# Patient Record
Sex: Male | Born: 1970 | Race: White | Hispanic: No | Marital: Married | Smoking: Former smoker
Health system: Southern US, Community
[De-identification: ages and names within clinical notes are randomized; demographics above are authoritative.]

## PROBLEM LIST (undated history)

## (undated) DIAGNOSIS — E049 Nontoxic goiter, unspecified: Secondary | ICD-10-CM

## (undated) HISTORY — PX: CHOLECYSTECTOMY: SHX55

## (undated) HISTORY — PX: HERNIA REPAIR: SHX51

## (undated) HISTORY — PX: TOTAL THYROIDECTOMY: SHX2547

---

## 2001-01-12 ENCOUNTER — Emergency Department (HOSPITAL_COMMUNITY): Admission: EM | Admit: 2001-01-12 | Discharge: 2001-01-12 | Payer: Self-pay | Admitting: Emergency Medicine

## 2007-10-26 ENCOUNTER — Encounter: Payer: Self-pay | Admitting: Gastroenterology

## 2007-10-28 ENCOUNTER — Encounter: Payer: Self-pay | Admitting: Gastroenterology

## 2007-12-29 ENCOUNTER — Encounter: Payer: Self-pay | Admitting: Gastroenterology

## 2008-11-07 ENCOUNTER — Encounter: Payer: Self-pay | Admitting: Gastroenterology

## 2008-11-07 ENCOUNTER — Emergency Department (HOSPITAL_BASED_OUTPATIENT_CLINIC_OR_DEPARTMENT_OTHER): Admission: EM | Admit: 2008-11-07 | Discharge: 2008-11-07 | Payer: Self-pay | Admitting: Emergency Medicine

## 2008-12-05 ENCOUNTER — Telehealth: Payer: Self-pay | Admitting: Gastroenterology

## 2011-08-29 LAB — CBC
HCT: 50.3 % (ref 39.0–52.0)
Hemoglobin: 17 g/dL (ref 13.0–17.0)
MCHC: 33.7 g/dL (ref 30.0–36.0)
MCV: 85.8 fL (ref 78.0–100.0)
RBC: 5.87 MIL/uL — ABNORMAL HIGH (ref 4.22–5.81)
RDW: 13.2 % (ref 11.5–15.5)

## 2011-08-29 LAB — URINALYSIS, ROUTINE W REFLEX MICROSCOPIC
Ketones, ur: NEGATIVE mg/dL
Nitrite: NEGATIVE
Protein, ur: NEGATIVE mg/dL
pH: 6.5 (ref 5.0–8.0)

## 2011-08-29 LAB — COMPREHENSIVE METABOLIC PANEL
ALT: 40 U/L (ref 0–53)
BUN: 17 mg/dL (ref 6–23)
CO2: 24 mEq/L (ref 19–32)
Calcium: 10.1 mg/dL (ref 8.4–10.5)
Creatinine, Ser: 1.2 mg/dL (ref 0.4–1.5)
GFR calc non Af Amer: >60 mL/min (ref >60)
Glucose, Bld: 128 mg/dL — ABNORMAL HIGH (ref 70–99)
Sodium: 139 mEq/L (ref 135–145)
Total Protein: 7.4 g/dL (ref 6.0–8.3)

## 2011-08-29 LAB — DIFFERENTIAL
Eosinophils Absolute: 0.1 10*3/uL (ref 0.0–0.7)
Lymphs Abs: 1.9 10*3/uL (ref 0.7–4.0)
Monocytes Relative: 8 % (ref 3–12)
Neutro Abs: 5.9 10*3/uL (ref 1.7–7.7)
Neutrophils Relative %: 68 % (ref 43–77)

## 2011-08-29 LAB — LIPASE, BLOOD: Lipase: 120 U/L (ref 23–300)

## 2014-02-22 ENCOUNTER — Emergency Department (HOSPITAL_BASED_OUTPATIENT_CLINIC_OR_DEPARTMENT_OTHER)
Admission: EM | Admit: 2014-02-22 | Discharge: 2014-02-22 | Disposition: A | Discharge status: Home / Self Care | Payer: BC Managed Care – PPO | Attending: Emergency Medicine | Admitting: Emergency Medicine

## 2014-02-22 ENCOUNTER — Encounter (HOSPITAL_BASED_OUTPATIENT_CLINIC_OR_DEPARTMENT_OTHER): Payer: Self-pay | Admitting: Emergency Medicine

## 2014-02-22 DIAGNOSIS — S51809A Unspecified open wound of unspecified forearm, initial encounter: Secondary | ICD-10-CM | POA: Insufficient documentation

## 2014-02-22 DIAGNOSIS — Z87891 Personal history of nicotine dependence: Secondary | ICD-10-CM | POA: Insufficient documentation

## 2014-02-22 DIAGNOSIS — Z79899 Other long term (current) drug therapy: Secondary | ICD-10-CM | POA: Insufficient documentation

## 2014-02-22 DIAGNOSIS — Y929 Unspecified place or not applicable: Secondary | ICD-10-CM | POA: Insufficient documentation

## 2014-02-22 DIAGNOSIS — IMO0001 Reserved for inherently not codable concepts without codable children: Secondary | ICD-10-CM | POA: Insufficient documentation

## 2014-02-22 DIAGNOSIS — S51859A Open bite of unspecified forearm, initial encounter: Secondary | ICD-10-CM

## 2014-02-22 DIAGNOSIS — Y939 Activity, unspecified: Secondary | ICD-10-CM | POA: Insufficient documentation

## 2014-02-22 DIAGNOSIS — E049 Nontoxic goiter, unspecified: Secondary | ICD-10-CM | POA: Insufficient documentation

## 2014-02-22 DIAGNOSIS — Z23 Encounter for immunization: Secondary | ICD-10-CM | POA: Insufficient documentation

## 2014-02-22 DIAGNOSIS — W5501XA Bitten by cat, initial encounter: Secondary | ICD-10-CM

## 2014-02-22 HISTORY — DX: Nontoxic goiter, unspecified: E04.9

## 2014-02-22 MED ORDER — TETANUS-DIPHTH-ACELL PERTUSSIS 5-2.5-18.5 LF-MCG/0.5 IM SUSP
0.5000 mL | Freq: Once | INTRAMUSCULAR | Status: AC
  Administered 2014-02-22: 0.5 mL via INTRAMUSCULAR
  Filled 2014-02-22: qty 0.5

## 2014-02-22 MED ORDER — AMOXICILLIN-POT CLAVULANATE 875-125 MG PO TABS: 1.0000 | ORAL_TABLET | Freq: Two times a day (BID) | ORAL | Status: DC

## 2014-02-22 MED ORDER — AMOXICILLIN-POT CLAVULANATE 875-125 MG PO TABS
ORAL_TABLET | ORAL | Status: DC
Start: 2014-02-22 — End: 2014-02-22
  Filled 2014-02-22: qty 1

## 2014-02-22 MED ORDER — AMOXICILLIN-POT CLAVULANATE 875-125 MG PO TABS
1.0000 | ORAL_TABLET | Freq: Once | ORAL | Status: AC
  Administered 2014-02-22: 1 via ORAL

## 2014-02-22 NOTE — ED Notes (Signed)
MD at bedside. 

## 2014-02-22 NOTE — Discharge Instructions (Signed)

## 2014-02-22 NOTE — ED Notes (Signed)
Pt bit on left arm by his own cat.  4 distinct puncture wounds.  No active bleeding at this time.  Cat is current on all immunizations.  Pt took 1 "old" Amoxicillin 500 mg at the insistence of his wife.

## 2014-02-22 NOTE — ED Provider Notes (Signed)
CSN: [id number]     Arrival date & time 02/22/14  0026 History   First MD Initiated Contact with Patient 02/22/14 0055     Chief Complaint  Patient presents with  . Animal Bite     (Consider location/radiation/quality/duration/timing/severity/associated sxs/prior Treatment) HPI This is a 43 year old male who was bitten on the left forearm by his own cat. His cat is an indoor only cat. His cat is up-to-date on his immunizations. There is mild to moderate pain associated with the resultant puncture wound. There are also a couple of superficial abrasions to his left forearm. He took amoxicillin 500 mg prior to arrival. This event occurred about 11:30 yesterday evening.  Past Medical History  Diagnosis Date  . Goiter    Past Surgical History  Procedure Laterality Date  . Cholecystectomy    . Total thyroidectomy    . Hernia repair     No family history on file. History  Substance Use Topics  . Smoking status: Former Games developermoker  . Smokeless tobacco: Not on file  . Alcohol Use: No    Review of Systems  All other systems reviewed and are negative.   Allergies  Erythromycin  Home Medications   Current Outpatient Rx  Name  Route  Sig  Dispense  Refill  . thyroid (ARMOUR) 90 MG tablet   Oral   Take 90 mg by mouth 2 (two) times daily.          BP 136/84  Pulse 92  Temp(Src) 98.1 F (36.7 C) (Oral)  Resp 20  Ht 6\' 2"  (1.88 m)  Wt 252 lb (114.306 kg)  BMI 32.34 kg/m2  SpO2 98%  Physical Exam General: Well-developed, well-nourished male in no acute distress; appearance consistent with age of record HENT: normocephalic; atraumatic Eyes: pupils equal, round and reactive to light; extraocular muscles intact Neck: supple Heart: regular rate and rhythm Lungs: clear to auscultation bilaterally Abdomen: soft; nondistended; nontender; no masses or hepatosplenomegaly; bowel sounds present Extremities: No deformity; full range of motion; pulses normal Neurologic: Awake, alert  and oriented; motor function intact in all extremities and symmetric; no facial droop Skin: Warm and dry; puncture wounds consistent with cat bite left forearm, a few superficial linear abrasions left forearm   Psychiatric: Normal mood and affect    ED Course  Procedures (including critical care time)  MDM      Hanley SeamenJohn L Serene Kopf, MD 02/22/14 0105

## 2014-07-22 ENCOUNTER — Encounter (HOSPITAL_BASED_OUTPATIENT_CLINIC_OR_DEPARTMENT_OTHER): Payer: Self-pay | Admitting: Emergency Medicine

## 2014-07-22 ENCOUNTER — Emergency Department (HOSPITAL_BASED_OUTPATIENT_CLINIC_OR_DEPARTMENT_OTHER): Payer: BC Managed Care – PPO

## 2014-07-22 ENCOUNTER — Emergency Department (HOSPITAL_BASED_OUTPATIENT_CLINIC_OR_DEPARTMENT_OTHER)
Admission: EM | Admit: 2014-07-22 | Discharge: 2014-07-22 | Disposition: A | Discharge status: Home / Self Care | Payer: BC Managed Care – PPO | Attending: Emergency Medicine | Admitting: Emergency Medicine

## 2014-07-22 DIAGNOSIS — Y9301 Activity, walking, marching and hiking: Secondary | ICD-10-CM | POA: Insufficient documentation

## 2014-07-22 DIAGNOSIS — S99929A Unspecified injury of unspecified foot, initial encounter: Secondary | ICD-10-CM

## 2014-07-22 DIAGNOSIS — Y929 Unspecified place or not applicable: Secondary | ICD-10-CM | POA: Insufficient documentation

## 2014-07-22 DIAGNOSIS — S9030XA Contusion of unspecified foot, initial encounter: Secondary | ICD-10-CM | POA: Diagnosis not present

## 2014-07-22 DIAGNOSIS — Z87891 Personal history of nicotine dependence: Secondary | ICD-10-CM | POA: Insufficient documentation

## 2014-07-22 DIAGNOSIS — Z79899 Other long term (current) drug therapy: Secondary | ICD-10-CM | POA: Diagnosis not present

## 2014-07-22 DIAGNOSIS — S8990XA Unspecified injury of unspecified lower leg, initial encounter: Secondary | ICD-10-CM | POA: Diagnosis present

## 2014-07-22 DIAGNOSIS — X500XXA Overexertion from strenuous movement or load, initial encounter: Secondary | ICD-10-CM | POA: Insufficient documentation

## 2014-07-22 DIAGNOSIS — S93609A Unspecified sprain of unspecified foot, initial encounter: Secondary | ICD-10-CM | POA: Insufficient documentation

## 2014-07-22 DIAGNOSIS — Z792 Long term (current) use of antibiotics: Secondary | ICD-10-CM | POA: Diagnosis not present

## 2014-07-22 DIAGNOSIS — S99919A Unspecified injury of unspecified ankle, initial encounter: Secondary | ICD-10-CM

## 2014-07-22 DIAGNOSIS — E049 Nontoxic goiter, unspecified: Secondary | ICD-10-CM | POA: Insufficient documentation

## 2014-07-22 DIAGNOSIS — S93602A Unspecified sprain of left foot, initial encounter: Secondary | ICD-10-CM

## 2014-07-22 MED ORDER — OXYCODONE-ACETAMINOPHEN 5-325 MG PO TABS: 1.0000 | ORAL_TABLET | ORAL | Status: DC | PRN

## 2014-07-22 MED ORDER — OXYCODONE-ACETAMINOPHEN 5-325 MG PO TABS
1.0000 | ORAL_TABLET | Freq: Once | ORAL | Status: AC
  Administered 2014-07-22: 1 via ORAL
  Filled 2014-07-22: qty 1

## 2014-07-22 NOTE — Discharge Instructions (Signed)
Ligament Sprain Ligaments are tough, fibrous tissues that hold bones together at the joints. A sprain can occur when a ligament is stretched. This injury may take several weeks to heal. HOME CARE INSTRUCTIONS   Rest the injured area for as long as directed by your caregiver. Then slowly start using the joint as directed by your caregiver and as the pain allows.  Keep the affected joint raised if possible to lessen swelling.  Apply ice for 15-20 minutes to the injured area every couple hours for the first half day, then 03-04 times per day for the first 48 hours. Put the ice in a plastic bag and place a towel between the bag of ice and your skin.  Wear any splinting, casting, or elastic bandage applications as instructed.  Only take over-the-counter or prescription medicines for pain, discomfort, or fever as directed by your caregiver. Do not use aspirin immediately after the injury unless instructed by your caregiver. Aspirin can cause increased bleeding and bruising of the tissues.  If you were given crutches, continue to use them as instructed and do not resume weight bearing on the affected extremity until instructed. SEEK MEDICAL CARE IF:   Your bruising, swelling, or pain increases.  You have cold and numb fingers or toes if your arm or leg was injured. SEEK IMMEDIATE MEDICAL CARE IF:   Your toes are numb or blue if your leg was injured.  Your fingers are numb or blue if your arm was injured.  Your pain is not responding to medicines and continues to stay the same or gets worse. MAKE SURE YOU:   Understand these instructions.  Will watch your condition.  Will get help right away if you are not doing well or get worse. Document Released: 11/07/2000 Document Revised: 02/02/2012 Document Reviewed: 09/05/2008 [facility] Patient Information 2015 [place], [place]. This information is not intended to replace advice given to you by your health care provider. Make sure you discuss any  questions you have with your health care provider.  Foot Sprain The muscles and cord like structures which attach muscle to bone (tendons) that surround the feet are made up of units. A foot sprain can occur at the weakest spot in any of these units. This condition is most often caused by injury to or overuse of the foot, as from playing contact sports, or aggravating a previous injury, or from poor conditioning, or obesity. SYMPTOMS  Pain with movement of the foot.  Tenderness and swelling at the injury site.  Loss of strength is present in moderate or severe sprains. THE THREE GRADES OR SEVERITY OF FOOT SPRAIN ARE:  Mild (Grade I): Slightly pulled muscle without tearing of muscle or tendon fibers or loss of strength.  Moderate (Grade II): Tearing of fibers in a muscle, tendon, or at the attachment to bone, with small decrease in strength.  Severe (Grade III): Rupture of the muscle-tendon-bone attachment, with separation of fibers. Severe sprain requires surgical repair. Often repeating (chronic) sprains are caused by overuse. Sudden (acute) sprains are caused by direct injury or over-use. DIAGNOSIS  Diagnosis of this condition is usually by your own observation. If problems continue, a caregiver may be required for further evaluation and treatment. X-rays may be required to make sure there are not breaks in the bones (fractures) present. Continued problems may require physical therapy for treatment. PREVENTION  Use strength and conditioning exercises appropriate for your sport.  Warm up properly prior to working out.  Use athletic shoes that are made  for the sport you are participating in.  Allow adequate time for healing. Early return to activities makes repeat injury more likely, and can lead to an unstable arthritic foot that can result in prolonged disability. Mild sprains generally heal in 3 to 10 days, with moderate and severe sprains taking 2 to 10 weeks. Your caregiver can help  you determine the proper time required for healing. HOME CARE INSTRUCTIONS   Apply ice to the injury for 15-20 minutes, 03-04 times per day. Put the ice in a plastic bag and place a towel between the bag of ice and your skin.  An elastic wrap (like an Ace bandage) may be used to keep swelling down.  Keep foot above the level of the heart, or at least raised on a footstool, when swelling and pain are present.  Try to avoid use other than gentle range of motion while the foot is painful. Do not resume use until instructed by your caregiver. Then begin use gradually, not increasing use to the point of pain. If pain does develop, decrease use and continue the above measures, gradually increasing activities that do not cause discomfort, until you gradually achieve normal use.  Use crutches if and as instructed, and for the length of time instructed.  Keep injured foot and ankle wrapped between treatments.  Massage foot and ankle for comfort and to keep swelling down. Massage from the toes up towards the knee.  Only take over-the-counter or prescription medicines for pain, discomfort, or fever as directed by your caregiver. SEEK IMMEDIATE MEDICAL CARE IF:   Your pain and swelling increase, or pain is not controlled with medications.  You have loss of feeling in your foot or your foot turns cold or blue.  You develop new, unexplained symptoms, or an increase of the symptoms that brought you to your caregiver. MAKE SURE YOU:   Understand these instructions.  Will watch your condition.  Will get help right away if you are not doing well or get worse. Document Released: 05/02/2002 Document Revised: 02/02/2012 Document Reviewed: 06/29/2008 [facility] Patient Information 2015 [place], [place]. This information is not intended to replace advice given to you by your health care provider. Make sure you discuss any questions you have with your health care provider.

## 2014-07-22 NOTE — ED Notes (Signed)
Pt states his teenaged-son has a cam walker at home; Dr. Blinda Leatherwood states it's okay for him to use it instead of getting a new one here. Danna Hefty, RN

## 2014-07-22 NOTE — ED Notes (Signed)
While walking down ramp pt turned over his foot

## 2014-07-22 NOTE — ED Notes (Signed)
I was asked to place ace wrap on patient ankle as he had cam walker at home. Patient's wife brought in set of crutches which I re-fit/adjusted. Patient's own crutches were were severely mal-adjusted. I completed and helped patient with crutch training.

## 2014-07-22 NOTE — ED Provider Notes (Signed)
CSN: [id number]     Arrival date & time 07/22/14  2219 History  This chart was scribed for Gilda Crease, * by Evon Slack, ED Scribe. This patient was seen in room MH11/MH11 and the patient's care was started at 10:40 PM.     Chief Complaint  Patient presents with  . Foot Injury   Patient is a 43 y.o. male presenting with foot injury. The history is provided by the patient. No language interpreter was used.  Foot Injury  HPI Comments: Carl Hicks is a 43 y.o. male who presents to the Emergency Department complaining of left foot injury onset PTA. He states he has associated swelling and bruising. He describes the pain as throbbing. He states was walking and rolled his ankle. He doesn't report any numbness or tingling.   Past Medical History  Diagnosis Date  . Goiter    Past Surgical History  Procedure Laterality Date  . Cholecystectomy    . Total thyroidectomy    . Hernia repair     History reviewed. No pertinent family history. History  Substance Use Topics  . Smoking status: Former Games developer  . Smokeless tobacco: Not on file  . Alcohol Use: No    Review of Systems  Musculoskeletal: Positive for arthralgias and joint swelling.  All other systems reviewed and are negative.   Allergies  Erythromycin  Home Medications   Prior to Admission medications   Medication Sig Start Date End Date Taking? Authorizing Provider  amoxicillin-clavulanate (AUGMENTIN) 875-125 MG per tablet Take 1 tablet by mouth 2 (two) times daily. One po bid x 7 days 02/22/14   Carlisle Beers Molpus, MD  oxyCODONE-acetaminophen (PERCOCET) 5-325 MG per tablet Take 1-2 tablets by mouth every 4 (four) hours as needed. 07/22/14   Gilda Crease, MD  thyroid (ARMOUR) 90 MG tablet Take 90 mg by mouth 2 (two) times daily.    Historical Provider, MD   Triage Vitals: BP 135/95  Pulse 101  Temp(Src) 99.4 F (37.4 C) (Oral)  Resp 20  Ht  (1.88 m)  Wt 258 lb (117.028 kg)  BMI 33.11 kg/m2  SpO2  97%  Physical Exam  Nursing note and vitals reviewed. Musculoskeletal: He exhibits tenderness.  Swelling, ecchymosis, tenderness on dorsal lateral aspect of left foot, no obvious deformity, Dorsal Pedis pulse 2+ Neurovascularly Intact, no proximal fibula tenderness    ED Course  Procedures (including critical care time) DIAGNOSTIC STUDIES: Oxygen Saturation is 97% on RA, normal by my interpretation.    COORDINATION OF CARE: 10:52 PM-Discussed treatment plan which includes left foot x-ray with pt at bedside and pt agreed to plan.     Labs Review Labs Reviewed - No data to display  Imaging Review Dg Foot Complete Left  07/22/2014   CLINICAL DATA:  Ankle injury while walking down a ramp.  EXAM: LEFT FOOT - COMPLETE 3+ VIEW  COMPARISON:  None.  FINDINGS: A well corticated fragment is evident lateral to the cuboid. There is some soft tissue swelling over the dorsum of the foot. No acute osseous abnormality is present.  IMPRESSION: 1. Soft tissue swelling over the dorsum of the foot. 2. No acute osseous abnormality.   Electronically Signed   By: Gennette Pac M.D.   On: 07/22/2014 22:48     EKG Interpretation None      MDM   Final diagnoses:  Foot sprain, left, initial encounter   Patient with pain and swelling at dorsal aspect of foot, xray negative. Will have  follow up with ortho (patient will try to see his son's ortho doctor in [facility], but was given on call provider contact in the event that he cannot arrange followup). Patient to use a cam walker, weightbearing as tolerated. He reports that he has a cam walker at home, did not take the Cam Walker here in the ER.   I personally performed the services described in this documentation, which was scribed in my presence. The recorded information has been reviewed and is accurate.      Gilda Crease, MD 07/22/14 [phone]

## 2015-01-24 IMAGING — CR DG FOOT COMPLETE 3+V*L*
3 series · 3 of 3 positions shown · non-contrast
Comparison: None.

CLINICAL DATA: Ankle injury while walking down a ramp.

EXAM:
LEFT FOOT - COMPLETE 3+ VIEW

[t foot ap left]
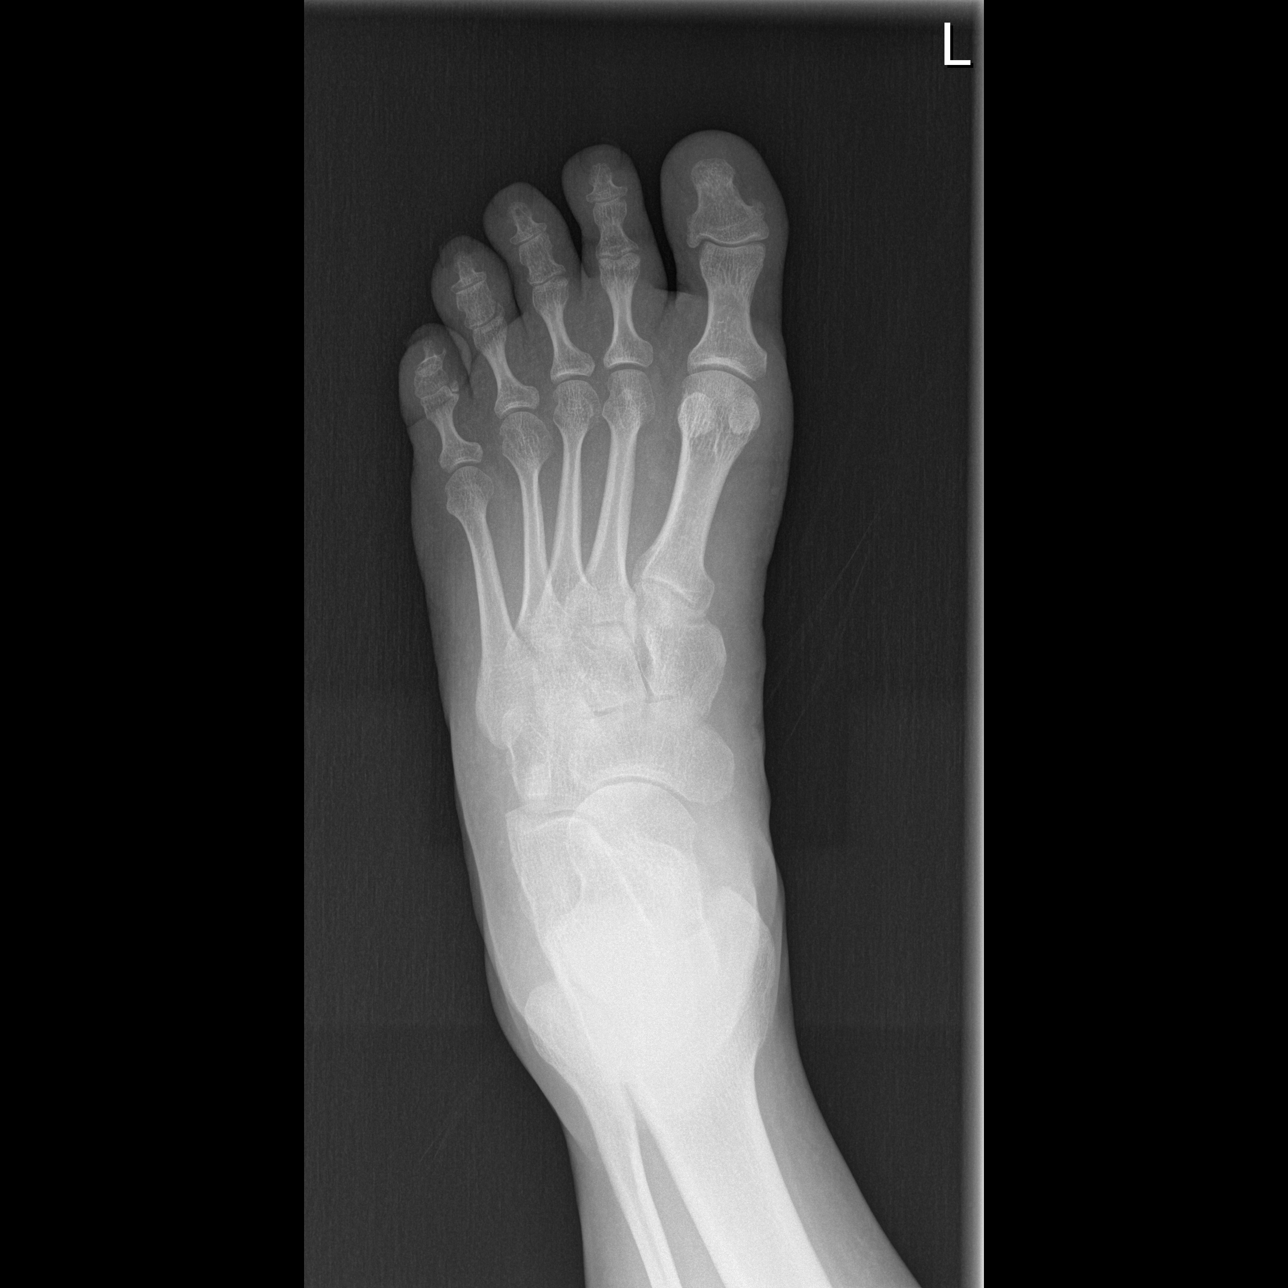

[t foot oblique left]
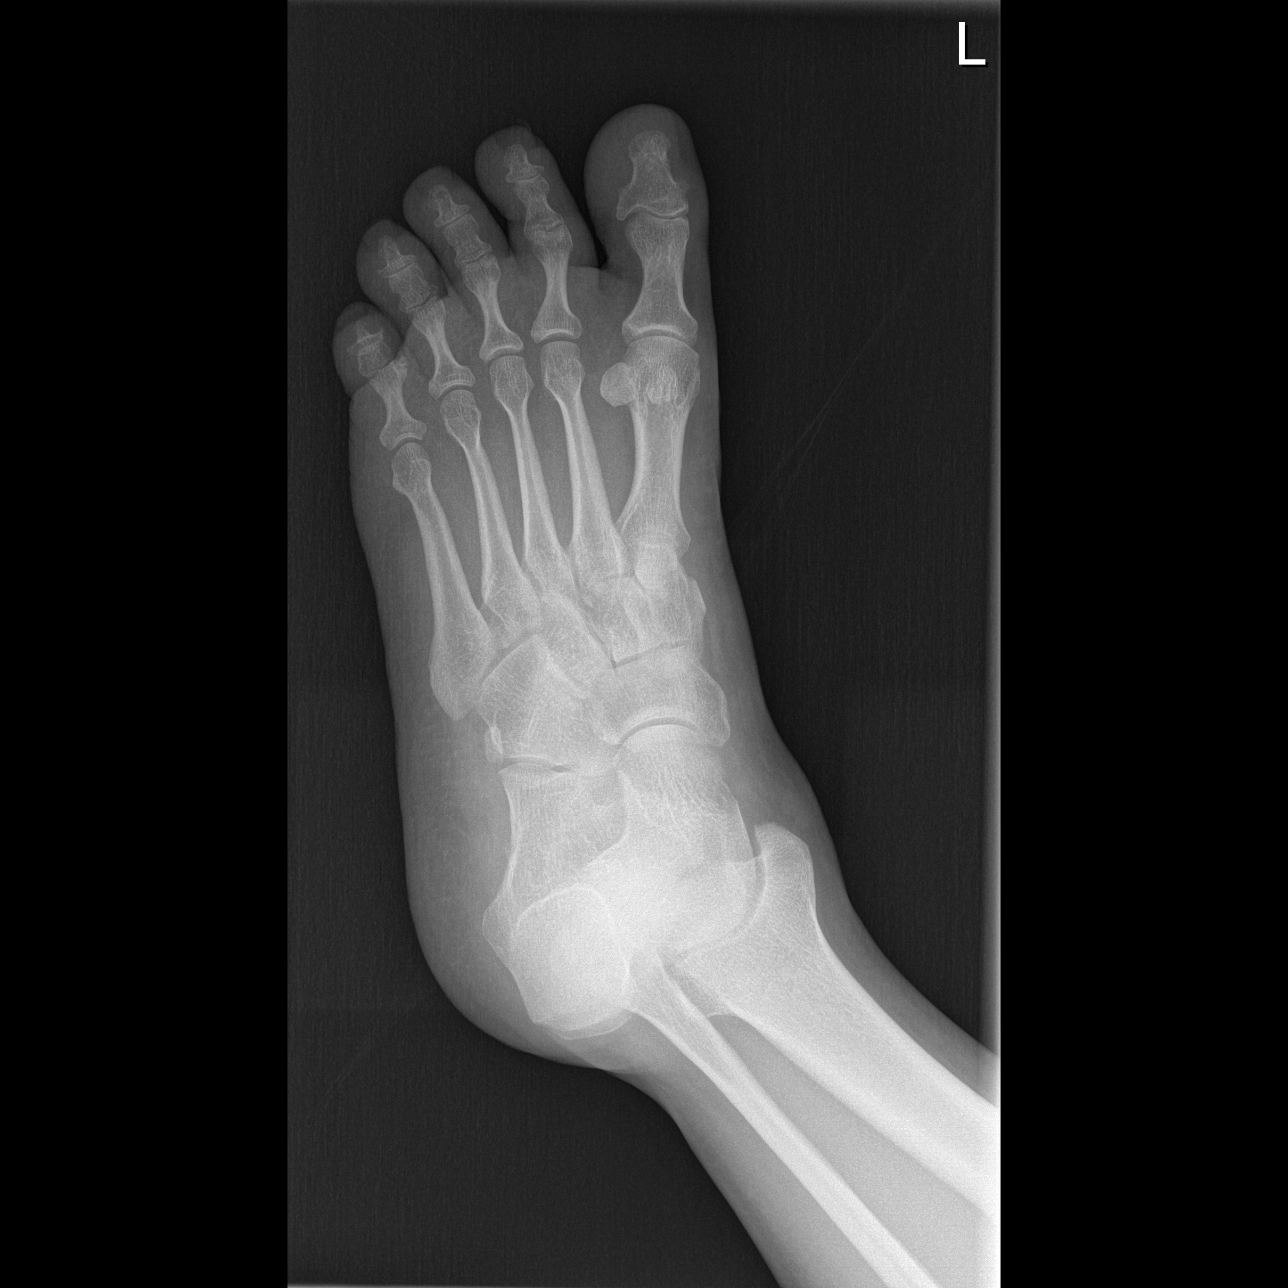

[t foot lat left]
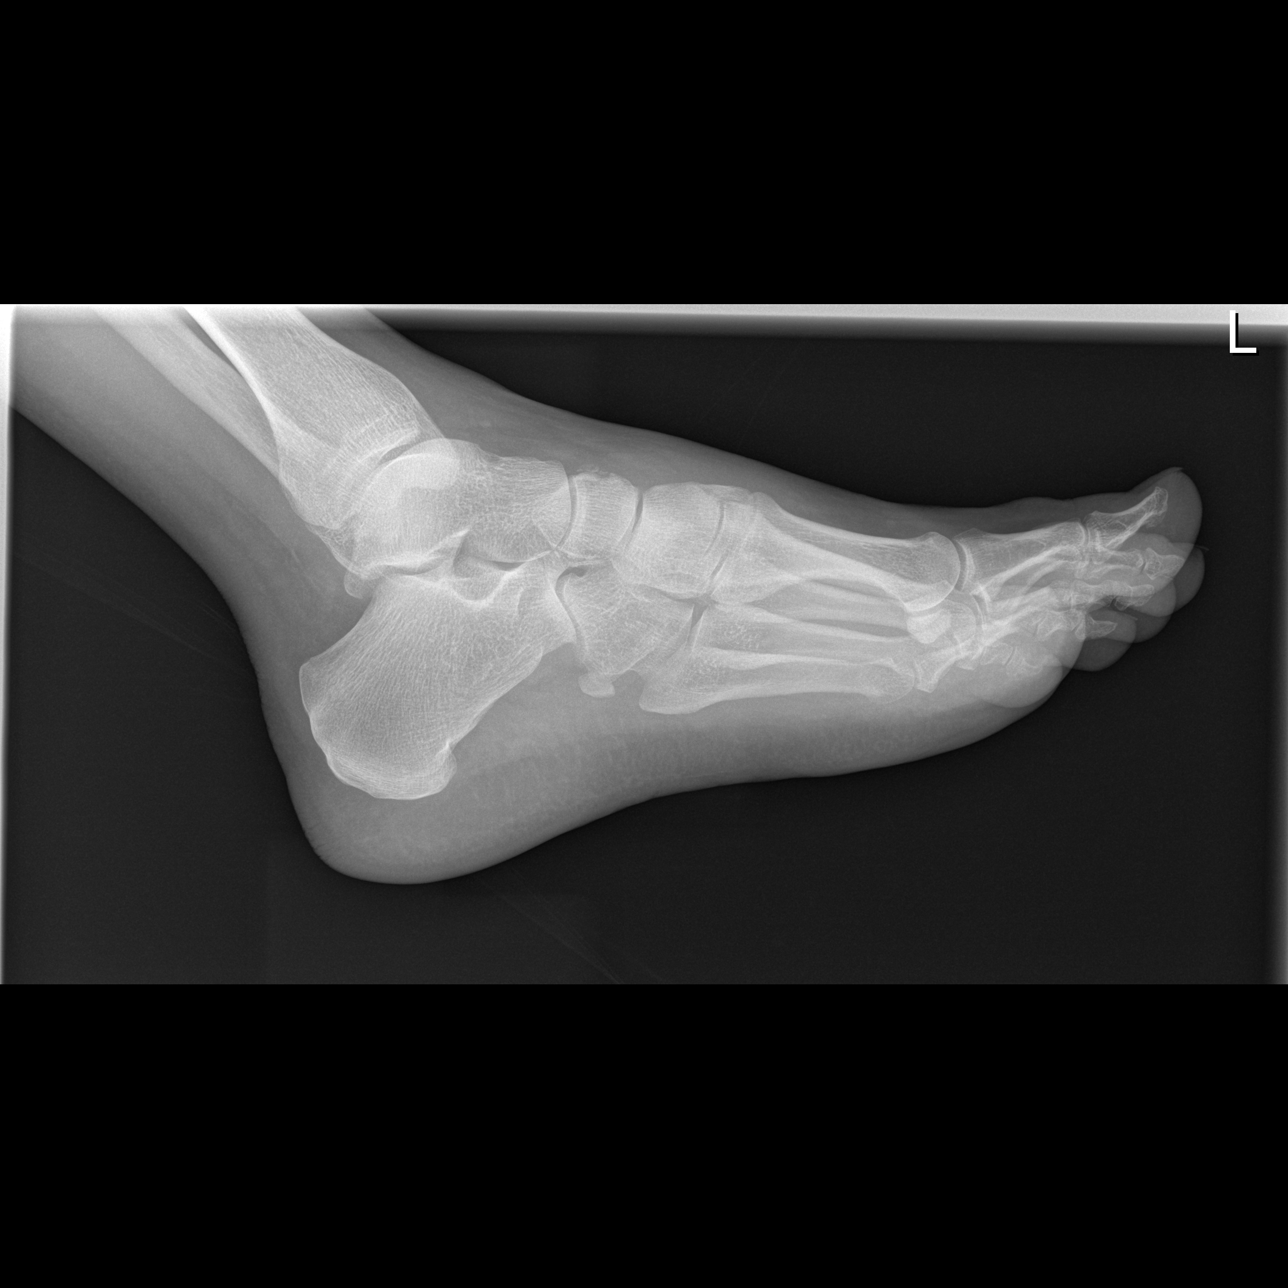

[3 of 3 positions shown; findings below may reference images not displayed]

FINDINGS: A well corticated fragment is evident lateral to the cuboid. There
is some soft tissue swelling over the dorsum of the foot. No acute
osseous abnormality is present.
IMPRESSION: 1. Soft tissue swelling over the dorsum of the foot.
2. No acute osseous abnormality.

## 2018-04-30 ENCOUNTER — Encounter: Payer: Self-pay | Admitting: Allergy & Immunology

## 2018-04-30 ENCOUNTER — Ambulatory Visit (INDEPENDENT_AMBULATORY_CARE_PROVIDER_SITE_OTHER): Payer: 59 | Admitting: Allergy & Immunology

## 2018-04-30 VITALS — BP 126/74 | HR 86 | Temp 98.1°F | Resp 17 | Ht 75.2 in | Wt 267.2 lb

## 2018-04-30 DIAGNOSIS — J302 Other seasonal allergic rhinitis: Secondary | ICD-10-CM | POA: Diagnosis not present

## 2018-04-30 DIAGNOSIS — J3089 Other allergic rhinitis: Secondary | ICD-10-CM | POA: Diagnosis not present

## 2018-04-30 DIAGNOSIS — E039 Hypothyroidism, unspecified: Secondary | ICD-10-CM | POA: Insufficient documentation

## 2018-04-30 DIAGNOSIS — I10 Essential (primary) hypertension: Secondary | ICD-10-CM | POA: Insufficient documentation

## 2018-04-30 MED ORDER — FEXOFENADINE HCL 180 MG PO TABS: 180.0000 mg | ORAL_TABLET | Freq: Every day | ORAL | 5 refills | Status: DC

## 2018-04-30 MED ORDER — MONTELUKAST SODIUM 10 MG PO TABS: 10.0000 mg | ORAL_TABLET | Freq: Every day | ORAL | 5 refills | Status: DC

## 2018-04-30 NOTE — Patient Instructions (Addendum)
1. Seasonal and perennial allergic rhinitis - Testing today showed: trees, weeds, indoor molds, outdoor molds, dust mites and cockroach - Avoidance measures provided. - Stop taking: Xyzal - Continue with: Nasacort (triamcinolone) one spray per nostril daily and Astelin (azelastine) 2 sprays per nostril 1-2 times daily as needed - Start taking: Allegra (fexofenadine) 180mg  table once daily, Singulair (montelukast) 10mg  daily and Pazeo (olopatadine) one drop per eye daily as needed - You can use an extra dose of the antihistamine, if needed, for breakthrough symptoms.  - Consider nasal saline rinses 1-2 times daily to remove allergens from the nasal cavities as well as help with mucous clearance (this is especially helpful to do before the nasal sprays are given) - Consider allergy shots as a means of long-term control. - Allergy shots "re-train" and "reset" the immune system to ignore environmental allergens and decrease the resulting immune response to those allergens (sneezing, itchy watery eyes, runny nose, nasal congestion, etc).    - Allergy shots improve symptoms in 75-85% of patients.  - Call your insurance company and let us know when you make a decision.  2. Return in about 6 months (around 10/30/2018).   Please inform us of any Emergency Department visits, hospitalizations, or changes in symptoms. Call us before going to the ED for breathing or allergy symptoms since we might be able to fit you in for a sick visit. Feel free to contact us anytime with any questions, problems, or concerns.  It was a pleasure to meet you today!  Websites that have reliable patient information: 1. American Academy of Asthma, Allergy, and Immunology: www.aaaai.org 2. Food Allergy Research and Education (FARE): foodallergy.org 3. Mothers of Asthmatics: http://www.asthmacommunitynetwork.org 4. American College of Allergy, Asthma, and Immunology: MissingWeapons.ca   Make sure you are registered to  vote!    Reducing Pollen Exposure  The American Academy of Allergy, Asthma and Immunology suggests the following steps to reduce your exposure to pollen during allergy seasons.    1. Do not hang sheets or clothing out to dry; pollen may collect on these items. 2. Do not mow lawns or spend time around freshly cut grass; mowing stirs up pollen. 3. Keep windows closed at night.  Keep car windows closed while driving. 4. Minimize morning activities outdoors, a time when pollen counts are usually at their highest. 5. Stay indoors as much as possible when pollen counts or humidity is high and on windy days when pollen tends to remain in the air longer. 6. Use air conditioning when possible.  Many air conditioners have filters that trap the pollen spores. 7. Use a HEPA room air filter to remove pollen form the indoor air you breathe.  Control of Mold Allergen   Mold and fungi can grow on a variety of surfaces provided certain temperature and moisture conditions exist.  Outdoor molds grow on plants, decaying vegetation and soil.  The major outdoor mold, Alternaria and Cladosporium, are found in very high numbers during hot and dry conditions.  Generally, a late Summer - Fall peak is seen for common outdoor fungal spores.  Rain will temporarily lower outdoor mold spore count, but counts rise rapidly when the rainy period ends.  The most important indoor molds are Aspergillus and Penicillium.  Dark, humid and poorly ventilated basements are ideal sites for mold growth.  The next most common sites of mold growth are the bathroom and the kitchen.  Outdoor (Seasonal) Mold Control  Positive outdoor molds via skin testing: Alternaria  1. Use  air conditioning and keep windows closed 2. Avoid exposure to decaying vegetation. 3. Avoid leaf raking. 4. Avoid grain handling. 5. Consider wearing a face mask if working in moldy areas.  6.   Indoor (Perennial) Mold Control   Positive indoor molds via skin  testing: Aspergillus, Penicillium, Fusarium, Aureobasidium (Pullulara) and Rhizopus  1. Maintain humidity below 50%. 2. Clean washable surfaces with 5% bleach solution. 3. Remove sources e.g. contaminated carpets.     Control of House Dust Mite Allergen    House dust mites play a major role in allergic asthma and rhinitis.  They occur in environments with high humidity wherever human skin, the food for dust mites is found. High levels have been detected in dust obtained from mattresses, pillows, carpets, upholstered furniture, bed covers, clothes and soft toys.  The principal allergen of the house dust mite is found in its feces.  A gram of dust may contain 1,000 mites and 250,000 fecal particles.  Mite antigen is easily measured in the air during house cleaning activities.    1. Encase mattresses, including the box spring, and pillow, in an air tight cover.  Seal the zipper end of the encased mattresses with wide adhesive tape. 2. Wash the bedding in water of 130 degrees Farenheit weekly.  Avoid cotton comforters/quilts and flannel bedding: the most ideal bed covering is the dacron comforter. 3. Remove all upholstered furniture from the bedroom. 4. Remove carpets, carpet padding, rugs, and non-washable window drapes from the bedroom.  Wash drapes weekly or use plastic window coverings. 5. Remove all non-washable stuffed toys from the bedroom.  Wash stuffed toys weekly. 6. Have the room cleaned frequently with a vacuum cleaner and a damp dust-mop.  The patient should not be in a room which is being cleaned and should wait 1 hour after cleaning before going into the room. 7. Close and seal all heating outlets in the bedroom.  Otherwise, the room will become filled with dust-laden air.  An electric heater can be used to heat the room. 8. Reduce indoor humidity to less than 50%.  Do not use a humidifier.  Control of Cockroach Allergen  Cockroach allergen has been identified as an important  cause of acute attacks of asthma, especially in urban settings.  There are fifty-five species of cockroach that exist in the [place], however only three, the [place], [place] and Oriental species produce allergen that can affect patients with Asthma.  Allergens can be obtained from fecal particles, egg casings and secretions from cockroaches.    1. Remove food sources. 2. Reduce access to water. 3. Seal access and entry points. 4. Spray runways with 0.5-1% Diazinon or Chlorpyrifos 5. Blow boric acid power under stoves and refrigerator. 6. Place bait stations (hydramethylnon) at feeding sites.  Allergy Shots   Allergies are the result of a chain reaction that starts in the immune system. Your immune system controls how your body defends itself. For instance, if you have an allergy to pollen, your immune system identifies pollen as an invader or allergen. Your immune system overreacts by producing antibodies called Immunoglobulin E (IgE). These antibodies travel to cells that release chemicals, causing an allergic reaction.  The concept behind allergy immunotherapy, whether it is received in the form of shots or tablets, is that the immune system can be desensitized to specific allergens that trigger allergy symptoms. Although it requires time and patience, the payback can be long-term relief.  How Do Allergy Shots Work?  Allergy shots work much  like a vaccine. Your body responds to injected amounts of a particular allergen given in increasing doses, eventually developing a resistance and tolerance to it. Allergy shots can lead to decreased, minimal or no allergy symptoms.  There generally are two phases: build-up and maintenance. Build-up often ranges from three to six months and involves receiving injections with increasing amounts of the allergens. The shots are typically given once or twice a week, though more rapid build-up schedules are sometimes used.  The maintenance phase begins  when the most effective dose is reached. This dose is different for each person, depending on how allergic you are and your response to the build-up injections. Once the maintenance dose is reached, there are longer periods between injections, typically two to four weeks.  Occasionally doctors give cortisone-type shots that can temporarily reduce allergy symptoms. These types of shots are different and should not be confused with allergy immunotherapy shots.  Who Can Be Treated with Allergy Shots?  Allergy shots may be a good treatment approach for people with allergic rhinitis (hay fever), allergic asthma, conjunctivitis (eye allergy) or stinging insect allergy.   Before deciding to begin allergy shots, you should consider:  . The length of allergy season and the severity of your symptoms . Whether medications and/or changes to your environment can control your symptoms . Your desire to avoid long-term medication use . Time: allergy immunotherapy requires a major time commitment . Cost: may vary depending on your insurance coverage  Allergy shots for children age 9 and older are effective and often well tolerated. They might prevent the onset of new allergen sensitivities or the progression to asthma.  Allergy shots are not started on patients who are pregnant but can be continued on patients who become pregnant while receiving them. In some patients with other medical conditions or who take certain common medications, allergy shots may be of risk. It is important to mention other medications you talk to your allergist.   When Will I Feel Better?  Some may experience decreased allergy symptoms during the build-up phase. For others, it may take as long as 12 months on the maintenance dose. If there is no improvement after a year of maintenance, your allergist will discuss other treatment options with you.  If you aren't responding to allergy shots, it may be because there is not enough  dose of the allergen in your vaccine or there are missing allergens that were not identified during your allergy testing. Other reasons could be that there are high levels of the allergen in your environment or major exposure to non-allergic triggers like tobacco smoke.  What Is the Length of Treatment?  Once the maintenance dose is reached, allergy shots are generally continued for three to five years. The decision to stop should be discussed with your allergist at that time. Some people may experience a permanent reduction of allergy symptoms. Others may relapse and a longer course of allergy shots can be considered.  What Are the Possible Reactions?  The two types of adverse reactions that can occur with allergy shots are local and systemic. Common local reactions include very mild redness and swelling at the injection site, which can happen immediately or several hours after. A systemic reaction, which is less common, affects the entire body or a particular body system. They are usually mild and typically respond quickly to medications. Signs include increased allergy symptoms such as sneezing, a stuffy nose or hives.  Rarely, a serious systemic reaction called anaphylaxis can  develop. Symptoms include swelling in the throat, wheezing, a feeling of tightness in the chest, nausea or dizziness. Most serious systemic reactions develop within 30 minutes of allergy shots. This is why it is strongly recommended you wait in your doctor's office for 30 minutes after your injections. Your allergist is trained to watch for reactions, and his or her staff is trained and equipped with the proper medications to identify and treat them.  Who Should Administer Allergy Shots?  The preferred location for receiving shots is your prescribing allergist's office. Injections can sometimes be given at another facility where the physician and staff are trained to recognize and treat reactions, and have received  instructions by your prescribing allergist.    You can buy saline nose drops at a pharmacy, or you can make your own saline solution:  1. Add 1 cup (240 mL) distilled water to a clean container. If you use tap water, boil it first to sterilize it, and then let it cool until it is lukewarm.  2. Add 0.5 tsp (2.5 g) salt to the water. 3. Add 0.5 tsp (2.5 g) baking soda.

## 2018-04-30 NOTE — Progress Notes (Signed)
NEW PATIENT  Date of Service/Encounter:  04/30/18  Referring provider: Heron Nay, PA   Assessment:   Seasonal and perennial allergic rhinitis (trees, weeds, indoor molds, outdoor molds, dust mites and cockroach)  Plan/Recommendations:   1. Seasonal and perennial allergic rhinitis - Testing today showed: trees, weeds, indoor molds, outdoor molds, dust mites and cockroach - Avoidance measures provided. - Stop taking: Xyzal - Continue with: Nasacort (triamcinolone) one spray per nostril daily and Astelin (azelastine) 2 sprays per nostril 1-2 times daily as needed - Start taking: Allegra (fexofenadine) 180mg  table once daily, Singulair (montelukast) 10mg  daily and Pazeo (olopatadine) one drop per eye daily as needed - You can use an extra dose of the antihistamine, if needed, for breakthrough symptoms.  - Consider nasal saline rinses 1-2 times daily to remove allergens from the nasal cavities as well as help with mucous clearance (this is especially helpful to do before the nasal sprays are given) - Consider allergy shots as a means of long-term control. - Allergy shots "re-train" and "reset" the immune system to ignore environmental allergens and decrease the resulting immune response to those allergens (sneezing, itchy watery eyes, runny nose, nasal congestion, etc).    - Allergy shots improve symptoms in 75-85% of patients.  - Call your insurance company and let us know when you make a decision.  2. Return in about 6 months (around 10/30/2018).   Subjective:   Ulus Hazen is a 47 y.o. male presenting today for evaluation of  Chief Complaint  Patient presents with  . Allergic Rhinitis     Paden Senger has a history of the following: Patient Active Problem List   Diagnosis Date Noted  . Seasonal and perennial allergic rhinitis 04/30/2018  . Acquired hypothyroidism 04/30/2018  . Essential hypertension 04/30/2018    History obtained from: chart review and  patient.  Tahje Borawski was referred by Tobie Lords PA at [facility]  Samba is a 47 y.o. male presenting to establish care for environmental allergies. He has had symptoms for years but they were particularly bad this year. He has been on Zyrtec which causes sleepiness. He tends to have marked sleepiness with antihistamines as well as jitteriness. Xyzal did not knock him out quite as much but is not as effective. He might have tried Careers adviser. He has not tried Claritin. He typically uses cold compresses and endures the symptoms. He does have a nose spray but he has not used the Nasacort very often at all. He might have been on Astelin in the past. He does use [facility] All[facility] Eye drops which does provide some relief.   He does get treated with antibiotics for sinus infections and ear infections. He estimates that he hs treated every other year for sinus infections. This is very rare overall.   He was previously seen by [facility]. He had testing that was positive to tree and grass. He does not recall what happened with the practice but he never followed up with them. He has never been on shots in the past.   He has psoriasis and uses nothing in particular; this seems to be more of a contact dermatitis since he reports that it was worse when he had particular arm rests. He is on amlodipine for hypertension as well as thyroid medication. He has a CPAP machine for OSA. He sees a PA at [facility].   Otherwise, there is no history of other atopic diseases, including drug allergies, food allergies, stinging insect allergies, or urticaria.  There is no significant infectious history. Vaccinations are up to date.    Past Medical History: Patient Active Problem List   Diagnosis Date Noted  . Seasonal and perennial allergic rhinitis 04/30/2018  . Acquired hypothyroidism 04/30/2018  . Essential hypertension 04/30/2018    Medication List:  Allergies as of 04/30/2018      Reactions   Erythromycin     Gastritis      Medication List        Accurate as of 04/30/18  1:03 PM. Always use your most recent med list.          amLODipine 5 MG tablet Commonly known as:  NORVASC TK 1 TO 2 TS PO D   azelastine 0.1 % nasal spray Commonly known as:  ASTELIN Place into the nose.   fexofenadine 180 MG tablet Commonly known as:  ALLEGRA Take 1 tablet (180 mg total) by mouth daily.   ketoconazole 2 % shampoo Commonly known as:  NIZORAL APP EXT TO THE SCALP 3 TIMES A WK   levocetirizine 5 MG tablet Commonly known as:  XYZAL Take by mouth.   montelukast 10 MG tablet Commonly known as:  SINGULAIR Take 1 tablet (10 mg total) by mouth at bedtime.   thyroid 90 MG tablet Commonly known as:  ARMOUR Take 90 mg by mouth 2 (two) times daily.       Birth History: non-contributory.   Developmental History: non-contributory.   Past Surgical History: Past Surgical History:  Procedure Laterality Date  . CHOLECYSTECTOMY    . HERNIA REPAIR    . TOTAL THYROIDECTOMY       Family History: Family History  Problem Relation Age of Onset  . Asthma Father      Social History: Lyda PeroneKirk lives at home with his wife and three children. He has two twin 12yo daughters as well as an 18yo son. They live in a 14yo home with hardwoods in the main living areas and carpeting in the bedroom. They have gas heating and central cooling. There are two cats and one dog. There are no dust mite coverings on the bedding. There is no current tobacco exposure. He did smoke from age 47 through his 3530s.    Review of Systems: a 14-point review of systems is pertinent for what is mentioned in HPI.  Otherwise, all other systems were negative. Constitutional: negative other than that listed in the HPI Eyes: negative other than that listed in the HPI Ears, nose, mouth, throat, and face: negative other than that listed in the HPI Respiratory: negative other than that listed in the HPI Cardiovascular: negative other than  that listed in the HPI Gastrointestinal: negative other than that listed in the HPI Genitourinary: negative other than that listed in the HPI Integument: negative other than that listed in the HPI Hematologic: negative other than that listed in the HPI Musculoskeletal: negative other than that listed in the HPI Neurological: negative other than that listed in the HPI Allergy/Immunologic: negative other than that listed in the HPI    Objective:   Blood pressure 126/74, pulse 86, temperature 98.1 F (36.7 C), resp. rate 17, height 6' 3.2" (1.91 m), weight 267 lb 3.2 oz (121.2 kg), SpO2 94 %. Body mass index is 33.22 kg/m.   Physical Exam:  General: Alert, interactive, in no acute distress. Pleasant male.  Eyes: No conjunctival injection bilaterally, no discharge on the right, no discharge on the left and no Horner-Trantas dots present. PERRL bilaterally. EOMI without pain. No photophobia.  Ears: Right TM pearly gray with normal light reflex, Left TM pearly gray with normal light reflex, Right TM intact without perforation and Left TM intact without perforation.  Nose/Throat: External nose within normal limits and septum midline. Turbinates edematous and pale with clear discharge. Posterior oropharynx erythematous without cobblestoning in the posterior oropharynx. Tonsils 2+ without exudates.  Tongue without thrush. Neck: Supple without thyromegaly. Trachea midline. Adenopathy: no enlarged lymph nodes appreciated in the anterior cervical, occipital, axillary, epitrochlear, inguinal, or popliteal regions. Lungs: Clear to auscultation without wheezing, rhonchi or rales. No increased work of breathing. CV: Normal S1/S2. No murmurs. Capillary refill <2 seconds.  Abdomen: Nondistended, nontender. No guarding or rebound tenderness. Bowel sounds present in all fields and hyperactive  Skin: Warm and dry, without lesions or rashes. Extremities:  No clubbing, cyanosis or edema. Neuro:   Grossly  intact. No focal deficits appreciated. Responsive to questions.  Diagnostic studies:    Allergy Studies:   Indoor/Outdoor Percutaneous Adult Environmental Panel: positive to cocklebur, hickory, pecan pollen, black walnut pollen, Alternaria and tobacco. Otherwise negative with adequate controls.  Indoor/Outdoor Selected Intradermal Environmental Panel: positive to ragweed mix, weed mix, mold mix #2, mold mix #4, cockroach and mite mix. Otherwise negative with adequate controls.     Malachi Bonds, MD [facility]

## 2020-04-04 ENCOUNTER — Ambulatory Visit (INDEPENDENT_AMBULATORY_CARE_PROVIDER_SITE_OTHER): Payer: 59 | Admitting: Family Medicine

## 2020-04-04 ENCOUNTER — Encounter: Payer: Self-pay | Admitting: Family Medicine

## 2020-04-04 ENCOUNTER — Other Ambulatory Visit: Payer: Self-pay

## 2020-04-04 VITALS — BP 122/84 | HR 90 | Temp 97.2°F | Resp 16 | Ht 75.0 in | Wt 284.0 lb

## 2020-04-04 DIAGNOSIS — H1010 Acute atopic conjunctivitis, unspecified eye: Secondary | ICD-10-CM | POA: Insufficient documentation

## 2020-04-04 DIAGNOSIS — J302 Other seasonal allergic rhinitis: Secondary | ICD-10-CM

## 2020-04-04 DIAGNOSIS — J3089 Other allergic rhinitis: Secondary | ICD-10-CM

## 2020-04-04 MED ORDER — MONTELUKAST SODIUM 10 MG PO TABS: 10.0000 mg | ORAL_TABLET | Freq: Every day | ORAL | 5 refills | Status: AC

## 2020-04-04 NOTE — Progress Notes (Addendum)
[address] [place] [address] Dept: [phone]  FOLLOW UP NOTE  Patient ID: Carl Hicks, male    DOB: 02/09/1971  Age: 49 y.o. MRN: [id number] Date of Office Visit: 04/04/2020  Assessment  Chief Complaint: Allergies  HPI Carl Hicks is a 49 year old male who presents to the clinic for a follow up visit.  He was last seen in this clinic on 04/30/2018 by Dr. Dellis Anes for evaluation of allergic rhinitis.  At today's visit he reports his allergic rhinitis has been poorly controlled with symptoms including nasal congestion, clear rhinorrhea, and scratchy throat that have been occurring over the last 2 months.  He takes Xyzal seasonally for about the last 3 years and has run out of montelukast.  Allergic conjunctivitis is reported as poorly controlled with watery, itchy, crusty eyes for which he is using Visine a with no relief of symptoms.  He is interested in beginning allergen immunotherapy at this time.  His current medications are listed in the chart.   Drug Allergies:  Allergies  Allergen Reactions  . Erythromycin     Gastritis     Physical Exam: BP 122/84   Pulse 90   Temp (!) 97.2 F (36.2 C) (Temporal)   Resp 16   Ht 6\' 3"  (1.905 m)   Wt 284 lb (128.8 kg)   SpO2 90%   BMI 35.50 kg/m    Physical Exam Vitals reviewed.  Constitutional:      Appearance: Normal appearance.  HENT:     Head: Normocephalic and atraumatic.     Right Ear: Tympanic membrane normal.     Left Ear: Tympanic membrane normal.     Nose:     Comments: Bilateral nares slightly erythematous with clear nasal drainage noted.  Pharynx slightly erythematous with no exudate.  Ears normal.  Eyes injected bilaterally.  Periorbital erythema noted.  Eyes:     Conjunctiva/sclera: Conjunctivae normal.  Cardiovascular:     Rate and Rhythm: Normal rate and regular rhythm.     Heart sounds: Normal heart sounds. No murmur.  Pulmonary:     Effort: Pulmonary effort is normal.     Breath sounds: Normal  breath sounds.     Comments: Lungs clear to auscultation Musculoskeletal:        General: Normal range of motion.     Cervical back: Normal range of motion and neck supple.  Skin:    General: Skin is warm and dry.  Neurological:     Mental Status: He is alert and oriented to person, place, and time.  Psychiatric:        Mood and Affect: Mood normal.        Behavior: Behavior normal.        Thought Content: Thought content normal.        Judgment: Judgment normal.     Assessment and Plan: 1. Seasonal and perennial allergic rhinitis   2. Seasonal allergic conjunctivitis     Meds ordered this encounter  Medications  . montelukast (SINGULAIR) 10 MG tablet    Sig: Take 1 tablet (10 mg total) by mouth at bedtime.    Dispense:  30 tablet    Refill:  5    Patient Instructions  Allergic rhinitis Continue allergen avoidance measures Restart montelukast 10 mg once a day for allergy symptoms. Patient cautioned that rarely some children/adults can experience behavioral changes after beginning montelukast. These side effects are rare, however, if you notice any change, notify the clinic and discontinue  montelukast. Continue Xyzal (levocetirizine) 5 mg once a day as needed for a runny nose Continue Nasacort 2 sprays in each nostril one spray in each nostril once a day as needed for a stuffy nose.  In the right nostril, point the applicator out toward the right ear. In the left nostril, point the applicator out toward the left ear Consider saline nasal rinses as needed for nasal symptoms. Use this before any medicated nasal sprays for best result If the treatment plan listed above is not controlling your allergies, consider allergen immunotherapy. Make an appointment for 2-3 weeks from today for your first allergy injection.   Allergic conjunctivitis Begin Pataday eye drops once a day as needed for red, itchy eyes.  Recommend a lubricating eye drop before allergy eye drop.   Follow up in 3  months or sooner if needed   Return in about 3 months (around 07/05/2020), or if symptoms worsen or fail to improve.    Thank you for the opportunity to care for this patient.  Please do not hesitate to contact me with questions.  Gareth Morgan, FNP [facility]  ________________________________________________  I have provided oversight concerning [facility]'s evaluation and treatment of this patient's health issues addressed during today's encounter.  I agree with the assessment and therapeutic plan as outlined in the note.   Signed,   R Edgar Frisk, MD

## 2020-04-04 NOTE — Patient Instructions (Addendum)
Allergic rhinitis Continue allergen avoidance measures Restart montelukast 10 mg once a day for allergy symptoms. Patient cautioned that rarely some children/adults can experience behavioral changes after beginning montelukast. These side effects are rare, however, if you notice any change, notify the clinic and discontinue montelukast. Continue Xyzal (levocetirizine) 5 mg once a day as needed for a runny nose Continue Nasacort 2 sprays in each nostril one spray in each nostril once a day as needed for a stuffy nose.  In the right nostril, point the applicator out toward the right ear. In the left nostril, point the applicator out toward the left ear Consider saline nasal rinses as needed for nasal symptoms. Use this before any medicated nasal sprays for best result If the treatment plan listed above is not controlling your allergies, consider allergen immunotherapy. Make an appointment for 2-3 weeks from today for your first allergy injection.   Allergic conjunctivitis Begin Pataday eye drops once a day as needed for red, itchy eyes.  Recommend a lubricating eye drop before allergy eye drop.  Eye Allergies Eye Allergies, also called allergic conjunctivitis, are common in our [place], [place] area. They occur when something (an allergen) irritates the eyes and triggers a reaction which involves release of histamine and other substances. Symptoms include itchy, red, teary, burning eyes, and itchy, puffy eyelids. Usually both eyes are affected. Nasal allergies may also be present with an itchy, stuffy nose and sneezing. Allergies often run in families. Allergies can be seasonal or yearlong.Common allergens include pollen (from grass, trees, ragweed, etc.), mold, pet dander, dust, smoke, perfumes, cosmetics, and even foods. What can be done:  .Avoid the allergen! Glasses/sunglasses and a hat outside can help. Close windows and use air conditioning when the pollen counts are high. Vacuum filters, bedding  covers, washing bedding often, and keeping pets out of your bedroom can help. Marland KitchenAvoid rubbing your eyes. .Cool compresses may be soothing. .Artificial Tears can rinse allergens from the eyes. We recommend Preservative Free. Refrigerating the drops can be soothing. .Reduce/Avoid contact lens wear. If contacts are used, consider instilling an allergy eye drop (such as Pataday) at least 10 minutes before inserting contact lenses. Medications (read labels and warnings): OTC medication eye drops: .Decongestants: Relieves redness. For short term use (use beyond a few to several days may lead to a "rebound" increase in redness). Marland KitchenAntihistamine: Relieves itching. .Decongestant/Antihistamine Combinations (such as Naphcon-A, Opcon-A, Visine-A) can temporarily relieve eye allergy symptoms. Antihistamine/Mast Cell Stabilizer drops: (such as Alaway, Zaditor, Pataday) can treat and prevent eye allergy symptoms. Formerly prescription, very effective, with considerable cost savings! OTC Allergy pills such as antihistamines can help overall symptoms but may worsen eye symptoms due to their drying effect. If used, consider increasing Artificial Tear use. At times, prescription medications may be needed. These include other Antihistamine/Mast Cell Stabilizer drops, NSAID drops, and Steroid medications (which may need monitoring). Allergy testing and evaluation by an Allergist may be helpful. Note: While most allergic conjunctivitis can be mild and annoying, some types can be more serious. We recommend seeking medical attention if your symptoms are severe or persist despite treatment efforts, if there is a thick discharge or pain, if only one eye is involved, if the vision is impaired or you have marked light sensitivity, if you have blisters or a rash, or a history of Herpes or Shingles in or around your eyes. If you've had recent eye surgery or exposure to someone with pinkeye, please call.  Call the  clinic if this treatment plan is  not working well for you  Follow up in 3 months or sooner if needed.  Reducing Pollen Exposure The American Academy of Allergy, Asthma and Immunology suggests the following steps to reduce your exposure to pollen during allergy seasons. 1. Do not hang sheets or clothing out to dry; pollen may collect on these items. 2. Do not mow lawns or spend time around freshly cut grass; mowing stirs up pollen. 3. Keep windows closed at night.  Keep car windows closed while driving. 4. Minimize morning activities outdoors, a time when pollen counts are usually at their highest. 5. Stay indoors as much as possible when pollen counts or humidity is high and on windy days when pollen tends to remain in the air longer. 6. Use air conditioning when possible.  Many air conditioners have filters that trap the pollen spores. 7. Use a HEPA room air filter to remove pollen form the indoor air you breathe.  Control of Mold Allergen Mold and fungi can grow on a variety of surfaces provided certain temperature and moisture conditions exist.  Outdoor molds grow on plants, decaying vegetation and soil.  The major outdoor mold, Alternaria and Cladosporium, are found in very high numbers during hot and dry conditions.  Generally, a late Summer - Fall peak is seen for common outdoor fungal spores.  Rain will temporarily lower outdoor mold spore count, but counts rise rapidly when the rainy period ends.  The most important indoor molds are Aspergillus and Penicillium.  Dark, humid and poorly ventilated basements are ideal sites for mold growth.  The next most common sites of mold growth are the bathroom and the kitchen.  Outdoor [employer] 8. Use air conditioning and keep windows closed 9. Avoid exposure to decaying vegetation. 10. Avoid leaf raking. 11. Avoid grain handling. 12. Consider wearing a face mask if working in moldy areas.  Indoor Mold Control 1. Maintain humidity below  50%. 2. Clean washable surfaces with 5% bleach solution. 3. Remove sources e.g. Contaminated carpets.

## 2020-04-09 DIAGNOSIS — J3089 Other allergic rhinitis: Secondary | ICD-10-CM | POA: Diagnosis not present

## 2020-04-09 DIAGNOSIS — J302 Other seasonal allergic rhinitis: Secondary | ICD-10-CM | POA: Diagnosis not present

## 2020-04-09 NOTE — Progress Notes (Signed)
VIALS EXP 04-09-21 

## 2020-04-09 NOTE — Progress Notes (Signed)
We received notification from Carl Hicks that he is interested in pursuing allergen immunotherapy. Prescriptions written and routed to the Immunotherapy Team.    Malachi Bonds, MD  [facility]

## 2020-04-09 NOTE — Addendum Note (Signed)
Addended by: Alfonse Spruce on: 04/09/2020 07:01 AM   Modules accepted: Orders

## 2020-04-26 ENCOUNTER — Ambulatory Visit (INDEPENDENT_AMBULATORY_CARE_PROVIDER_SITE_OTHER): Payer: 59

## 2020-04-26 ENCOUNTER — Other Ambulatory Visit: Payer: Self-pay

## 2020-04-26 DIAGNOSIS — J309 Allergic rhinitis, unspecified: Secondary | ICD-10-CM | POA: Diagnosis not present

## 2020-04-26 MED ORDER — EPINEPHRINE 0.3 MG/0.3ML IJ SOAJ: INTRAMUSCULAR | 3 refills | Status: DC

## 2020-04-26 NOTE — Progress Notes (Signed)
Immunotherapy   Patient Details  Name: Carl Hicks MRN: [id number] Date of Birth: 1971/11/05  04/26/2020  Carl Hicks Patient started [facility] and [id number] Following schedule: A  Frequency:ONCE A WEEK Epi-Pen:YES Consent signed and patient instructions given.  Patient did not have an Auvi Q today.  Sent in rx per Carl Leyland, FNP of AuviQ 0.3 mg to [facility] pharmacy.  Patient aware. Instructions for Carl Hicks reviewed with patient.  Patient will wait for call for delivery information from [facility].   Carl Hicks Carl Hicks 04/26/2020, 12:05 PM

## 2020-04-26 NOTE — Progress Notes (Signed)
This encounter was created in error - please disregard.

## 2020-05-07 ENCOUNTER — Ambulatory Visit (INDEPENDENT_AMBULATORY_CARE_PROVIDER_SITE_OTHER): Payer: 59

## 2020-05-07 DIAGNOSIS — J309 Allergic rhinitis, unspecified: Secondary | ICD-10-CM

## 2020-05-17 ENCOUNTER — Ambulatory Visit (INDEPENDENT_AMBULATORY_CARE_PROVIDER_SITE_OTHER): Payer: 59

## 2020-05-17 DIAGNOSIS — J309 Allergic rhinitis, unspecified: Secondary | ICD-10-CM | POA: Diagnosis not present

## 2020-05-24 ENCOUNTER — Ambulatory Visit (INDEPENDENT_AMBULATORY_CARE_PROVIDER_SITE_OTHER): Payer: 59

## 2020-05-24 DIAGNOSIS — J309 Allergic rhinitis, unspecified: Secondary | ICD-10-CM | POA: Diagnosis not present

## 2020-06-01 ENCOUNTER — Ambulatory Visit (INDEPENDENT_AMBULATORY_CARE_PROVIDER_SITE_OTHER): Payer: 59

## 2020-06-01 DIAGNOSIS — J309 Allergic rhinitis, unspecified: Secondary | ICD-10-CM | POA: Diagnosis not present

## 2020-06-08 ENCOUNTER — Ambulatory Visit (INDEPENDENT_AMBULATORY_CARE_PROVIDER_SITE_OTHER): Payer: 59

## 2020-06-08 DIAGNOSIS — J309 Allergic rhinitis, unspecified: Secondary | ICD-10-CM

## 2020-06-15 ENCOUNTER — Ambulatory Visit (INDEPENDENT_AMBULATORY_CARE_PROVIDER_SITE_OTHER): Payer: 59

## 2020-06-15 DIAGNOSIS — J309 Allergic rhinitis, unspecified: Secondary | ICD-10-CM | POA: Diagnosis not present

## 2020-06-22 ENCOUNTER — Ambulatory Visit (INDEPENDENT_AMBULATORY_CARE_PROVIDER_SITE_OTHER): Payer: 59

## 2020-06-22 DIAGNOSIS — J309 Allergic rhinitis, unspecified: Secondary | ICD-10-CM

## 2020-06-29 ENCOUNTER — Ambulatory Visit (INDEPENDENT_AMBULATORY_CARE_PROVIDER_SITE_OTHER): Payer: 59

## 2020-06-29 DIAGNOSIS — J309 Allergic rhinitis, unspecified: Secondary | ICD-10-CM

## 2020-07-06 ENCOUNTER — Ambulatory Visit (INDEPENDENT_AMBULATORY_CARE_PROVIDER_SITE_OTHER): Payer: 59

## 2020-07-06 DIAGNOSIS — J309 Allergic rhinitis, unspecified: Secondary | ICD-10-CM | POA: Diagnosis not present

## 2020-07-16 ENCOUNTER — Ambulatory Visit (INDEPENDENT_AMBULATORY_CARE_PROVIDER_SITE_OTHER): Payer: 59

## 2020-07-16 DIAGNOSIS — J309 Allergic rhinitis, unspecified: Secondary | ICD-10-CM

## 2020-07-27 ENCOUNTER — Ambulatory Visit (INDEPENDENT_AMBULATORY_CARE_PROVIDER_SITE_OTHER): Payer: 59 | Admitting: *Deleted

## 2020-07-27 DIAGNOSIS — J309 Allergic rhinitis, unspecified: Secondary | ICD-10-CM | POA: Diagnosis not present

## 2020-08-03 ENCOUNTER — Ambulatory Visit (INDEPENDENT_AMBULATORY_CARE_PROVIDER_SITE_OTHER): Payer: 59 | Admitting: *Deleted

## 2020-08-03 DIAGNOSIS — J309 Allergic rhinitis, unspecified: Secondary | ICD-10-CM | POA: Diagnosis not present

## 2020-08-10 ENCOUNTER — Ambulatory Visit (INDEPENDENT_AMBULATORY_CARE_PROVIDER_SITE_OTHER): Payer: 59

## 2020-08-10 DIAGNOSIS — J309 Allergic rhinitis, unspecified: Secondary | ICD-10-CM | POA: Diagnosis not present

## 2020-08-17 ENCOUNTER — Ambulatory Visit (INDEPENDENT_AMBULATORY_CARE_PROVIDER_SITE_OTHER): Payer: 59

## 2020-08-17 DIAGNOSIS — J309 Allergic rhinitis, unspecified: Secondary | ICD-10-CM

## 2020-08-24 ENCOUNTER — Ambulatory Visit (INDEPENDENT_AMBULATORY_CARE_PROVIDER_SITE_OTHER): Payer: 59

## 2020-08-24 DIAGNOSIS — J309 Allergic rhinitis, unspecified: Secondary | ICD-10-CM

## 2020-08-31 ENCOUNTER — Ambulatory Visit (INDEPENDENT_AMBULATORY_CARE_PROVIDER_SITE_OTHER): Payer: 59 | Admitting: *Deleted

## 2020-08-31 DIAGNOSIS — J309 Allergic rhinitis, unspecified: Secondary | ICD-10-CM

## 2020-10-01 ENCOUNTER — Ambulatory Visit (INDEPENDENT_AMBULATORY_CARE_PROVIDER_SITE_OTHER): Payer: 59

## 2020-10-01 DIAGNOSIS — J309 Allergic rhinitis, unspecified: Secondary | ICD-10-CM

## 2020-10-10 ENCOUNTER — Ambulatory Visit (INDEPENDENT_AMBULATORY_CARE_PROVIDER_SITE_OTHER): Payer: 59 | Admitting: *Deleted

## 2020-10-10 DIAGNOSIS — J309 Allergic rhinitis, unspecified: Secondary | ICD-10-CM | POA: Diagnosis not present

## 2020-10-22 ENCOUNTER — Ambulatory Visit (INDEPENDENT_AMBULATORY_CARE_PROVIDER_SITE_OTHER): Payer: 59

## 2020-10-22 DIAGNOSIS — J309 Allergic rhinitis, unspecified: Secondary | ICD-10-CM

## 2020-11-06 ENCOUNTER — Ambulatory Visit (INDEPENDENT_AMBULATORY_CARE_PROVIDER_SITE_OTHER): Payer: 59

## 2020-11-06 DIAGNOSIS — J309 Allergic rhinitis, unspecified: Secondary | ICD-10-CM

## 2020-11-20 ENCOUNTER — Telehealth: Payer: Self-pay

## 2020-11-20 ENCOUNTER — Ambulatory Visit (INDEPENDENT_AMBULATORY_CARE_PROVIDER_SITE_OTHER): Payer: 59

## 2020-11-20 DIAGNOSIS — J309 Allergic rhinitis, unspecified: Secondary | ICD-10-CM | POA: Diagnosis not present

## 2020-11-20 NOTE — Telephone Encounter (Signed)
We can change to Schedule B. That is fine with me as long as he has been doing well.  Malachi Bonds, MD [facility]

## 2020-11-20 NOTE — Telephone Encounter (Signed)
Pt only received 0.35 of gold vials.  Pt asked if he would have to repeat entire vial over again.  I told him we would send you a message & ask if he could go at an accelerated schedule.  Please advise.

## 2020-11-21 NOTE — Telephone Encounter (Signed)
Spoke to pt. To let him know it's ok to do the B schedule per Dr. Dellis Anes. Pt. States he just has some itching, but he isn't always consistent in taking his antihistamine. Pt. States he will make sure he takes his xyzal 24  hours prior to getting his allergy injection(s). Will note in his allergy injection flow sheet.

## 2020-11-21 NOTE — Progress Notes (Signed)
DILUTE VIALS TO GOLD. SCHED B IF NO PROBLEMS.

## 2020-11-30 ENCOUNTER — Ambulatory Visit (INDEPENDENT_AMBULATORY_CARE_PROVIDER_SITE_OTHER): Payer: 59

## 2020-11-30 DIAGNOSIS — J309 Allergic rhinitis, unspecified: Secondary | ICD-10-CM

## 2020-12-07 ENCOUNTER — Ambulatory Visit (INDEPENDENT_AMBULATORY_CARE_PROVIDER_SITE_OTHER): Payer: 59

## 2020-12-07 DIAGNOSIS — J309 Allergic rhinitis, unspecified: Secondary | ICD-10-CM

## 2020-12-17 ENCOUNTER — Ambulatory Visit (INDEPENDENT_AMBULATORY_CARE_PROVIDER_SITE_OTHER): Payer: 59

## 2020-12-17 DIAGNOSIS — J309 Allergic rhinitis, unspecified: Secondary | ICD-10-CM | POA: Diagnosis not present

## 2020-12-28 ENCOUNTER — Ambulatory Visit (INDEPENDENT_AMBULATORY_CARE_PROVIDER_SITE_OTHER): Payer: 59

## 2020-12-28 DIAGNOSIS — J309 Allergic rhinitis, unspecified: Secondary | ICD-10-CM

## 2021-01-04 ENCOUNTER — Ambulatory Visit (INDEPENDENT_AMBULATORY_CARE_PROVIDER_SITE_OTHER): Payer: 59

## 2021-01-04 DIAGNOSIS — J309 Allergic rhinitis, unspecified: Secondary | ICD-10-CM | POA: Diagnosis not present

## 2021-01-11 ENCOUNTER — Ambulatory Visit (INDEPENDENT_AMBULATORY_CARE_PROVIDER_SITE_OTHER): Payer: 59

## 2021-01-11 DIAGNOSIS — J309 Allergic rhinitis, unspecified: Secondary | ICD-10-CM

## 2021-01-24 ENCOUNTER — Ambulatory Visit (INDEPENDENT_AMBULATORY_CARE_PROVIDER_SITE_OTHER): Payer: 59

## 2021-01-24 DIAGNOSIS — J309 Allergic rhinitis, unspecified: Secondary | ICD-10-CM

## 2021-02-01 ENCOUNTER — Ambulatory Visit (INDEPENDENT_AMBULATORY_CARE_PROVIDER_SITE_OTHER): Payer: 59

## 2021-02-01 DIAGNOSIS — J309 Allergic rhinitis, unspecified: Secondary | ICD-10-CM | POA: Diagnosis not present

## 2021-02-13 ENCOUNTER — Ambulatory Visit (INDEPENDENT_AMBULATORY_CARE_PROVIDER_SITE_OTHER): Payer: 59

## 2021-02-13 DIAGNOSIS — J309 Allergic rhinitis, unspecified: Secondary | ICD-10-CM | POA: Diagnosis not present

## 2021-02-20 ENCOUNTER — Ambulatory Visit (INDEPENDENT_AMBULATORY_CARE_PROVIDER_SITE_OTHER): Payer: 59

## 2021-02-20 DIAGNOSIS — J309 Allergic rhinitis, unspecified: Secondary | ICD-10-CM | POA: Diagnosis not present

## 2021-03-01 ENCOUNTER — Ambulatory Visit (INDEPENDENT_AMBULATORY_CARE_PROVIDER_SITE_OTHER): Payer: 59

## 2021-03-01 DIAGNOSIS — J309 Allergic rhinitis, unspecified: Secondary | ICD-10-CM | POA: Diagnosis not present

## 2021-03-11 ENCOUNTER — Ambulatory Visit (INDEPENDENT_AMBULATORY_CARE_PROVIDER_SITE_OTHER): Payer: 59

## 2021-03-11 DIAGNOSIS — J309 Allergic rhinitis, unspecified: Secondary | ICD-10-CM | POA: Diagnosis not present

## 2021-03-12 DIAGNOSIS — J301 Allergic rhinitis due to pollen: Secondary | ICD-10-CM | POA: Diagnosis not present

## 2021-03-12 NOTE — Progress Notes (Addendum)
VIALS EXP 03-12-22.  Labels for billing

## 2021-03-18 DIAGNOSIS — J3089 Other allergic rhinitis: Secondary | ICD-10-CM | POA: Diagnosis not present

## 2021-03-21 ENCOUNTER — Ambulatory Visit (INDEPENDENT_AMBULATORY_CARE_PROVIDER_SITE_OTHER): Payer: 59

## 2021-03-21 DIAGNOSIS — J309 Allergic rhinitis, unspecified: Secondary | ICD-10-CM

## 2021-03-29 ENCOUNTER — Ambulatory Visit (INDEPENDENT_AMBULATORY_CARE_PROVIDER_SITE_OTHER): Payer: 59

## 2021-03-29 DIAGNOSIS — J309 Allergic rhinitis, unspecified: Secondary | ICD-10-CM | POA: Diagnosis not present

## 2021-04-04 ENCOUNTER — Ambulatory Visit (INDEPENDENT_AMBULATORY_CARE_PROVIDER_SITE_OTHER): Payer: 59

## 2021-04-04 DIAGNOSIS — J309 Allergic rhinitis, unspecified: Secondary | ICD-10-CM | POA: Diagnosis not present

## 2021-04-12 ENCOUNTER — Ambulatory Visit (INDEPENDENT_AMBULATORY_CARE_PROVIDER_SITE_OTHER): Payer: 59

## 2021-04-12 DIAGNOSIS — J309 Allergic rhinitis, unspecified: Secondary | ICD-10-CM

## 2021-04-24 ENCOUNTER — Ambulatory Visit (INDEPENDENT_AMBULATORY_CARE_PROVIDER_SITE_OTHER): Payer: 59

## 2021-04-24 DIAGNOSIS — J309 Allergic rhinitis, unspecified: Secondary | ICD-10-CM | POA: Diagnosis not present

## 2021-05-02 ENCOUNTER — Ambulatory Visit (INDEPENDENT_AMBULATORY_CARE_PROVIDER_SITE_OTHER): Payer: 59

## 2021-05-02 DIAGNOSIS — J309 Allergic rhinitis, unspecified: Secondary | ICD-10-CM

## 2021-05-17 ENCOUNTER — Ambulatory Visit (INDEPENDENT_AMBULATORY_CARE_PROVIDER_SITE_OTHER): Payer: 59

## 2021-05-17 DIAGNOSIS — J309 Allergic rhinitis, unspecified: Secondary | ICD-10-CM

## 2021-05-23 ENCOUNTER — Ambulatory Visit (INDEPENDENT_AMBULATORY_CARE_PROVIDER_SITE_OTHER): Payer: 59

## 2021-05-23 DIAGNOSIS — J309 Allergic rhinitis, unspecified: Secondary | ICD-10-CM

## 2021-06-24 ENCOUNTER — Ambulatory Visit (INDEPENDENT_AMBULATORY_CARE_PROVIDER_SITE_OTHER): Payer: 59

## 2021-06-24 DIAGNOSIS — J309 Allergic rhinitis, unspecified: Secondary | ICD-10-CM

## 2021-07-03 ENCOUNTER — Ambulatory Visit (INDEPENDENT_AMBULATORY_CARE_PROVIDER_SITE_OTHER): Payer: 59

## 2021-07-03 DIAGNOSIS — J309 Allergic rhinitis, unspecified: Secondary | ICD-10-CM | POA: Diagnosis not present

## 2021-07-12 ENCOUNTER — Ambulatory Visit (INDEPENDENT_AMBULATORY_CARE_PROVIDER_SITE_OTHER): Payer: 59 | Admitting: *Deleted

## 2021-07-12 DIAGNOSIS — J309 Allergic rhinitis, unspecified: Secondary | ICD-10-CM | POA: Diagnosis not present

## 2021-07-12 NOTE — Progress Notes (Signed)
Exp 07/12/22 

## 2021-07-15 DIAGNOSIS — J301 Allergic rhinitis due to pollen: Secondary | ICD-10-CM

## 2021-07-22 ENCOUNTER — Ambulatory Visit (INDEPENDENT_AMBULATORY_CARE_PROVIDER_SITE_OTHER): Payer: 59

## 2021-07-22 DIAGNOSIS — J309 Allergic rhinitis, unspecified: Secondary | ICD-10-CM | POA: Diagnosis not present

## 2021-07-30 ENCOUNTER — Ambulatory Visit (INDEPENDENT_AMBULATORY_CARE_PROVIDER_SITE_OTHER): Payer: 59

## 2021-07-30 DIAGNOSIS — J309 Allergic rhinitis, unspecified: Secondary | ICD-10-CM | POA: Diagnosis not present

## 2021-08-05 DIAGNOSIS — J3089 Other allergic rhinitis: Secondary | ICD-10-CM

## 2021-08-09 ENCOUNTER — Ambulatory Visit (INDEPENDENT_AMBULATORY_CARE_PROVIDER_SITE_OTHER): Payer: 59

## 2021-08-09 DIAGNOSIS — J309 Allergic rhinitis, unspecified: Secondary | ICD-10-CM

## 2021-08-13 ENCOUNTER — Ambulatory Visit: Payer: 59 | Admitting: Family Medicine

## 2021-08-13 NOTE — Progress Notes (Deleted)
   [address] [place] [address] Dept: [phone]  FOLLOW UP NOTE  Patient ID: Carl Hicks, male    DOB: [birth year]  Age: 50 y.o. MRN: [id number] Date of Office Visit: 08/13/2021  Assessment  Chief Complaint: No chief complaint on file.  HPI Carl Hicks is a 50 year old male who presents to the clinic for follow-up visit.  He was last seen in this clinic on 04/04/2020 by Thermon Leyland, FNP for evaluation of allergic rhinitis on allergen immunotherapy and allergic conjunctivitis.   Drug Allergies:  Allergies  Allergen Reactions   Erythromycin     Gastritis     Physical Exam: There were no vitals taken for this visit.   Physical Exam  Diagnostics:    Assessment and Plan: No diagnosis found.  No orders of the defined types were placed in this encounter.   There are no Patient Instructions on file for this visit.  No follow-ups on file.    Thank you for the opportunity to care for this patient.  Please do not hesitate to contact me with questions.  Thermon Leyland, FNP [facility]

## 2021-08-19 ENCOUNTER — Ambulatory Visit (INDEPENDENT_AMBULATORY_CARE_PROVIDER_SITE_OTHER): Payer: 59

## 2021-08-19 DIAGNOSIS — J309 Allergic rhinitis, unspecified: Secondary | ICD-10-CM

## 2021-08-29 ENCOUNTER — Ambulatory Visit (INDEPENDENT_AMBULATORY_CARE_PROVIDER_SITE_OTHER): Payer: 59

## 2021-08-29 DIAGNOSIS — J309 Allergic rhinitis, unspecified: Secondary | ICD-10-CM | POA: Diagnosis not present

## 2021-09-02 ENCOUNTER — Ambulatory Visit: Payer: 59 | Admitting: Family Medicine

## 2021-09-02 NOTE — Progress Notes (Deleted)
   [address] [place] [address] Dept: [phone]  FOLLOW UP NOTE  Patient ID: Carl Hicks, male    DOB: April 29, 1971  Age: 50 y.o. MRN: [id number] Date of Office Visit: 09/02/2021  Assessment  Chief Complaint: No chief complaint on file.  HPI Carl Hicks is a 50 year old male who presents the clinic for follow-up visit.  He was last seen in this clinic on 04/13/2020 for evaluation of allergic rhinitis on allergen immunotherapy and allergic conjunctivitis.   Drug Allergies:  Allergies  Allergen Reactions   Erythromycin     Gastritis     Physical Exam: There were no vitals taken for this visit.   Physical Exam  Diagnostics:    Assessment and Plan: No diagnosis found.  No orders of the defined types were placed in this encounter.   There are no Patient Instructions on file for this visit.  No follow-ups on file.    Thank you for the opportunity to care for this patient.  Please do not hesitate to contact me with questions.  Thermon Leyland, FNP [facility]

## 2021-09-04 ENCOUNTER — Encounter: Payer: Self-pay | Admitting: Family Medicine

## 2021-09-04 ENCOUNTER — Ambulatory Visit (INDEPENDENT_AMBULATORY_CARE_PROVIDER_SITE_OTHER): Payer: 59 | Admitting: Family Medicine

## 2021-09-04 ENCOUNTER — Other Ambulatory Visit: Payer: Self-pay

## 2021-09-04 VITALS — BP 132/86 | HR 98 | Temp 98.0°F | Resp 16 | Ht 74.0 in | Wt 300.6 lb

## 2021-09-04 DIAGNOSIS — H1010 Acute atopic conjunctivitis, unspecified eye: Secondary | ICD-10-CM

## 2021-09-04 DIAGNOSIS — H1013 Acute atopic conjunctivitis, bilateral: Secondary | ICD-10-CM

## 2021-09-04 DIAGNOSIS — T63481D Toxic effect of venom of other arthropod, accidental (unintentional), subsequent encounter: Secondary | ICD-10-CM

## 2021-09-04 DIAGNOSIS — J309 Allergic rhinitis, unspecified: Secondary | ICD-10-CM | POA: Diagnosis not present

## 2021-09-04 DIAGNOSIS — J302 Other seasonal allergic rhinitis: Secondary | ICD-10-CM | POA: Diagnosis not present

## 2021-09-04 DIAGNOSIS — J3089 Other allergic rhinitis: Secondary | ICD-10-CM

## 2021-09-04 MED ORDER — EPINEPHRINE 0.3 MG/0.3ML IJ SOAJ: INTRAMUSCULAR | 2 refills | Status: AC

## 2021-09-04 NOTE — Progress Notes (Signed)
[address] [place] [address] Dept: [phone]  FOLLOW UP NOTE  Patient ID: Carl Hicks, male    DOB: 03-01-1971  Age: 50 y.o. MRN: [id number] Date of Office Visit: 09/04/2021  Assessment  Chief Complaint: Allergic Rhinitis  (Has some itchy eyes after yard work, but not as bad as before )  HPI Carl Hicks is a 50 year old male who presents to the clinic for follow-up visit.  He was last seen in this clinic on 04/04/2020 for evaluation of allergic rhinitis on allergen immunotherapy and allergic conjunctivitis.  At today's visit, he reports allergic rhinitis has been moderately well controlled with occasional sneezing which occurs mainly at the beginning of spring and the beginning of fall.  He continues Xyzal 5 mg once a day and uses Nasacort as needed with relief of symptoms.  He continues allergen immunotherapy directed toward pollen, mold, dust mite, and cockroach with occasional small local reactions including heat and itch lasting for about a day.  He reports a significant decrease in his symptoms of allergic continuing on allergen immunotherapy.  Allergic conjunctivitis is reported as moderately well controlled with occasional red and itchy eyes especially after doing yard work.  He has used olopatadine previously, however, has not needed this over the last several months.  His current medications are listed in the chart.   Drug Allergies:  Allergies  Allergen Reactions   Erythromycin     Gastritis     Physical Exam: BP 132/86   Pulse 98   Temp 98 F (36.7 C)   Resp 16   Ht 6\' 2"  (1.88 m)   Wt (!) 300 lb 9.6 oz (136.4 kg)   SpO2 96%   BMI 38.59 kg/m    Physical Exam Vitals reviewed.  Constitutional:      Appearance: Normal appearance.  HENT:     Head: Normocephalic and atraumatic.     Right Ear: Tympanic membrane normal.     Left Ear: Tympanic membrane normal.     Nose:     Comments: Bilateral nares slightly erythematous with clear nasal drainage noted.   Pharynx normal.  Ears normal.  Eyes normal.    Mouth/Throat:     Pharynx: Oropharynx is clear.  Eyes:     Conjunctiva/sclera: Conjunctivae normal.  Cardiovascular:     Rate and Rhythm: Normal rate and regular rhythm.     Heart sounds: Normal heart sounds. No murmur heard. Pulmonary:     Effort: Pulmonary effort is normal.     Breath sounds: Normal breath sounds.     Comments: Lungs clear to auscultation Musculoskeletal:        General: Normal range of motion.     Cervical back: Normal range of motion and neck supple.  Skin:    General: Skin is warm and dry.  Neurological:     Mental Status: He is alert and oriented to person, place, and time.  Psychiatric:        Mood and Affect: Mood normal.        Behavior: Behavior normal.        Thought Content: Thought content normal.        Judgment: Judgment normal.   Assessment and Plan: 1. Seasonal and perennial allergic rhinitis   2. Seasonal allergic conjunctivitis     Meds ordered this encounter  Medications   EPINEPHrine (AUVI-Q) 0.3 mg/0.3 mL IJ SOAJ injection    Sig: Use as directed for severe allergic reaction.    Dispense:  2 each  Refill:  2    Dispense one carton of 2 devices plus trainer     Patient Instructions  Allergic rhinitis Continue avoidance measures directed toward pollen, mold, dust mite, and cockroach as listed below Continue allergen immunotherapy and have access to an epinephrine autoinjector set Continue Xyzal 5 mg once a day as needed for runny nose or itch Continue Nasacort 1 to 2 sprays in each nostril once a day as needed for stuffy nose Consider saline nasal rinses as needed for nasal symptoms. Use this before any medicated nasal sprays for best result  Allergic conjunctivitis Some over the counter eye drops include Pataday one drop in each eye once a day as needed for red, itchy eyes OR Zaditor one drop in each eye twice a day as needed for red itchy eyes.  Call the clinic if this treatment  plan is not working well for you.  Follow up in 1 year or sooner if needed.   Return in about 1 year (around 09/04/2022), or if symptoms worsen or fail to improve.    Thank you for the opportunity to care for this patient.  Please do not hesitate to contact me with questions.  Thermon Leyland, FNP [facility]

## 2021-09-04 NOTE — Patient Instructions (Signed)
Allergic rhinitis Continue avoidance measures directed toward pollen, mold, dust mite, and cockroach as listed below Continue allergen immunotherapy and have access to an epinephrine autoinjector set Continue Xyzal 5 mg once a day as needed for runny nose or itch Continue Nasacort 1 to 2 sprays in each nostril once a day as needed for stuffy nose Consider saline nasal rinses as needed for nasal symptoms. Use this before any medicated nasal sprays for best result  Allergic conjunctivitis Some over the counter eye drops include Pataday one drop in each eye once a day as needed for red, itchy eyes OR Zaditor one drop in each eye twice a day as needed for red itchy eyes.  Call the clinic if this treatment plan is not working well for you.  Follow up in 1 year or sooner if needed.  Reducing Pollen Exposure The American Academy of Allergy, Asthma and Immunology suggests the following steps to reduce your exposure to pollen during allergy seasons. Do not hang sheets or clothing out to dry; pollen may collect on these items. Do not mow lawns or spend time around freshly cut grass; mowing stirs up pollen. Keep windows closed at night.  Keep car windows closed while driving. Minimize morning activities outdoors, a time when pollen counts are usually at their highest. Stay indoors as much as possible when pollen counts or humidity is high and on windy days when pollen tends to remain in the air longer. Use air conditioning when possible.  Many air conditioners have filters that trap the pollen spores. Use a HEPA room air filter to remove pollen form the indoor air you breathe.  Control of Mold Allergen Mold and fungi can grow on a variety of surfaces provided certain temperature and moisture conditions exist.  Outdoor molds grow on plants, decaying vegetation and soil.  The major outdoor mold, Alternaria and Cladosporium, are found in very high numbers during hot and dry conditions.  Generally, a late  Summer - Fall peak is seen for common outdoor fungal spores.  Rain will temporarily lower outdoor mold spore count, but counts rise rapidly when the rainy period ends.  The most important indoor molds are Aspergillus and Penicillium.  Dark, humid and poorly ventilated basements are ideal sites for mold growth.  The next most common sites of mold growth are the bathroom and the kitchen.  Outdoor [employer] Use air conditioning and keep windows closed Avoid exposure to decaying vegetation. Avoid leaf raking. Avoid grain handling. Consider wearing a face mask if working in moldy areas.  Indoor Mold Control Maintain humidity below 50%. Clean washable surfaces with 5% bleach solution. Remove sources e.g. Contaminated carpets.   Control of Dust Mite Allergen Dust mites play a major role in allergic asthma and rhinitis. They occur in environments with high humidity wherever human skin is found. Dust mites absorb humidity from the atmosphere (ie, they do not drink) and feed on organic matter (including shed human and animal skin). Dust mites are a microscopic type of insect that you cannot see with the naked eye. High levels of dust mites have been detected from mattresses, pillows, carpets, upholstered furniture, bed covers, clothes, soft toys and any woven material. The principal allergen of the dust mite is found in its feces. A gram of dust may contain 1,000 mites and 250,000 fecal particles. Mite antigen is easily measured in the air during house cleaning activities. Dust mites do not bite and do not cause harm to humans, other than by triggering allergies/asthma.  Ways to decrease your exposure to dust mites in your home:  1. Encase mattresses, box springs and pillows with a mite-impermeable barrier or cover  2. Wash sheets, blankets and drapes weekly in hot water (130 F) with detergent and dry them in a dryer on the hot setting.  3. Have the room cleaned frequently with a vacuum cleaner and  a damp dust-mop. For carpeting or rugs, vacuuming with a vacuum cleaner equipped with a high-efficiency particulate air (HEPA) filter. The dust mite allergic individual should not be in a room which is being cleaned and should wait 1 hour after cleaning before going into the room.  4. Do not sleep on upholstered furniture (eg, couches).  5. If possible removing carpeting, upholstered furniture and drapery from the home is ideal. Horizontal blinds should be eliminated in the rooms where the person spends the most time (bedroom, study, television room). Washable vinyl, roller-type shades are optimal.  6. Remove all non-washable stuffed toys from the bedroom. Wash stuffed toys weekly like sheets and blankets above.  7. Reduce indoor humidity to less than 50%. Inexpensive humidity monitors can be purchased at most hardware stores. Do not use a humidifier as can make the problem worse and are not recommended.  Control of Cockroach Allergen Cockroach allergen has been identified as an important cause of acute attacks of asthma, especially in urban settings.  There are fifty-five species of cockroach that exist in the [place], however only three, the [place], [place] species produce allergen that can affect patients with Asthma.  Allergens can be obtained from fecal particles, egg casings and secretions from cockroaches.    Remove food sources. Reduce access to water. Seal access and entry points. Spray runways with 0.5-1% Diazinon or Chlorpyrifos Blow boric acid power under stoves and refrigerator. Place bait stations (hydramethylnon) at feeding sites.

## 2021-09-18 ENCOUNTER — Ambulatory Visit (INDEPENDENT_AMBULATORY_CARE_PROVIDER_SITE_OTHER): Payer: 59

## 2021-09-18 DIAGNOSIS — J309 Allergic rhinitis, unspecified: Secondary | ICD-10-CM

## 2021-09-23 ENCOUNTER — Ambulatory Visit (INDEPENDENT_AMBULATORY_CARE_PROVIDER_SITE_OTHER): Payer: 59

## 2021-09-23 DIAGNOSIS — J309 Allergic rhinitis, unspecified: Secondary | ICD-10-CM

## 2021-10-07 ENCOUNTER — Ambulatory Visit (INDEPENDENT_AMBULATORY_CARE_PROVIDER_SITE_OTHER): Payer: 59 | Admitting: *Deleted

## 2021-10-07 DIAGNOSIS — J309 Allergic rhinitis, unspecified: Secondary | ICD-10-CM

## 2021-10-14 ENCOUNTER — Ambulatory Visit (INDEPENDENT_AMBULATORY_CARE_PROVIDER_SITE_OTHER): Payer: 59

## 2021-10-14 DIAGNOSIS — J309 Allergic rhinitis, unspecified: Secondary | ICD-10-CM

## 2021-10-20 ENCOUNTER — Telehealth: Payer: Self-pay | Admitting: Allergy

## 2021-10-20 DIAGNOSIS — T63442D Toxic effect of venom of bees, intentional self-harm, subsequent encounter: Secondary | ICD-10-CM

## 2021-10-20 NOTE — Telephone Encounter (Signed)
Please call patient and see how he is doing.  Patient called stating that he got stung by a bee on his hand yesterday and today noticed that the swelling is worse but stops around the wrist area.  Denies any other symptoms. Minimal erythema.   He took Xyzal yesterday which did not help.  He is a [employer] manager and noticed that he is getting worsening localized symptoms with each subsequent stings.  He does have an epipen on hand. No prior work up.  Advised patient to use benadryl as needed additionally to his usual Xyzal. Ice the area. Take ibuprofen for the pain.  Patient sent pictures - swelling of the hand noted.  Discussed getting hymenoptera bloodwork which he would like to get. Also, if swelling not improved by next week may need a short burst of steroid.

## 2021-10-21 NOTE — Telephone Encounter (Signed)
Informed pt of the blood order he stated understanding lab ordered and he will go to [facility] this week to have the blood drawn

## 2021-10-21 NOTE — Telephone Encounter (Signed)
Pt called back and stated swelling in hand has went down and would like to be tested for be stings.

## 2021-10-21 NOTE — Telephone Encounter (Signed)
Can you please order hymenoptera panel for this patient? We can follow up in the clinic with results. Thank you

## 2021-10-21 NOTE — Addendum Note (Signed)
Addended by: Berna Bue on: 10/21/2021 12:58 PM   Modules accepted: Orders

## 2021-10-21 NOTE — Telephone Encounter (Signed)
Tried calling to check on pt but voicemail box is full

## 2021-10-21 NOTE — Addendum Note (Signed)
Addended by: Berna Bue on: 10/21/2021 02:39 PM   Modules accepted: Orders

## 2021-10-28 ENCOUNTER — Ambulatory Visit (INDEPENDENT_AMBULATORY_CARE_PROVIDER_SITE_OTHER): Payer: 59

## 2021-10-28 DIAGNOSIS — J309 Allergic rhinitis, unspecified: Secondary | ICD-10-CM

## 2021-11-03 LAB — ALLERGEN HYMENOPTERA PANEL
Bumblebee: 0.24 kU/L — AB
Honeybee IgE: 12.9 kU/L — AB
Hornet, White Face, IgE: 0.35 kU/L — AB
Hornet, Yellow, IgE: <0.1 kU/L
Paper Wasp IgE: <0.1 kU/L
Yellow Jacket, IgE: <0.1 kU/L

## 2021-11-04 ENCOUNTER — Encounter: Payer: Self-pay | Admitting: *Deleted

## 2021-11-04 NOTE — Progress Notes (Signed)
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 Please offer information regarding venom immunotherapy. Thank you

## 2021-11-07 ENCOUNTER — Ambulatory Visit (INDEPENDENT_AMBULATORY_CARE_PROVIDER_SITE_OTHER): Payer: 59

## 2021-11-07 DIAGNOSIS — J309 Allergic rhinitis, unspecified: Secondary | ICD-10-CM

## 2021-11-12 ENCOUNTER — Ambulatory Visit (INDEPENDENT_AMBULATORY_CARE_PROVIDER_SITE_OTHER): Payer: 59

## 2021-11-12 DIAGNOSIS — J309 Allergic rhinitis, unspecified: Secondary | ICD-10-CM | POA: Diagnosis not present

## 2021-11-13 NOTE — Addendum Note (Signed)
Addended by: Alfonse Spruce on: 11/13/2021 06:08 AM   Modules accepted: Orders

## 2021-11-20 ENCOUNTER — Ambulatory Visit (INDEPENDENT_AMBULATORY_CARE_PROVIDER_SITE_OTHER): Payer: 59

## 2021-11-20 DIAGNOSIS — J309 Allergic rhinitis, unspecified: Secondary | ICD-10-CM | POA: Diagnosis not present

## 2021-11-27 ENCOUNTER — Ambulatory Visit (INDEPENDENT_AMBULATORY_CARE_PROVIDER_SITE_OTHER): Payer: 59

## 2021-11-27 DIAGNOSIS — J309 Allergic rhinitis, unspecified: Secondary | ICD-10-CM | POA: Diagnosis not present

## 2021-12-05 DIAGNOSIS — J301 Allergic rhinitis due to pollen: Secondary | ICD-10-CM | POA: Diagnosis not present

## 2021-12-05 NOTE — Progress Notes (Signed)
VIALS MADE. EXP 12-05-22 °

## 2021-12-06 ENCOUNTER — Ambulatory Visit (INDEPENDENT_AMBULATORY_CARE_PROVIDER_SITE_OTHER): Payer: 59

## 2021-12-06 DIAGNOSIS — J309 Allergic rhinitis, unspecified: Secondary | ICD-10-CM

## 2021-12-09 DIAGNOSIS — J3089 Other allergic rhinitis: Secondary | ICD-10-CM | POA: Diagnosis not present

## 2021-12-11 ENCOUNTER — Other Ambulatory Visit: Payer: Self-pay

## 2021-12-11 ENCOUNTER — Ambulatory Visit (INDEPENDENT_AMBULATORY_CARE_PROVIDER_SITE_OTHER): Payer: 59

## 2021-12-11 DIAGNOSIS — T63441D Toxic effect of venom of bees, accidental (unintentional), subsequent encounter: Secondary | ICD-10-CM

## 2021-12-13 ENCOUNTER — Ambulatory Visit (INDEPENDENT_AMBULATORY_CARE_PROVIDER_SITE_OTHER): Payer: 59

## 2021-12-13 ENCOUNTER — Ambulatory Visit: Payer: 59

## 2021-12-13 DIAGNOSIS — J309 Allergic rhinitis, unspecified: Secondary | ICD-10-CM

## 2021-12-18 ENCOUNTER — Other Ambulatory Visit: Payer: Self-pay

## 2021-12-18 ENCOUNTER — Ambulatory Visit (INDEPENDENT_AMBULATORY_CARE_PROVIDER_SITE_OTHER): Payer: 59

## 2021-12-18 DIAGNOSIS — T63441D Toxic effect of venom of bees, accidental (unintentional), subsequent encounter: Secondary | ICD-10-CM | POA: Diagnosis not present

## 2021-12-20 ENCOUNTER — Ambulatory Visit (INDEPENDENT_AMBULATORY_CARE_PROVIDER_SITE_OTHER): Payer: 59 | Admitting: *Deleted

## 2021-12-20 DIAGNOSIS — J309 Allergic rhinitis, unspecified: Secondary | ICD-10-CM | POA: Diagnosis not present

## 2021-12-25 ENCOUNTER — Other Ambulatory Visit: Payer: Self-pay

## 2021-12-25 ENCOUNTER — Ambulatory Visit (INDEPENDENT_AMBULATORY_CARE_PROVIDER_SITE_OTHER): Payer: 59

## 2021-12-25 DIAGNOSIS — T63441D Toxic effect of venom of bees, accidental (unintentional), subsequent encounter: Secondary | ICD-10-CM | POA: Diagnosis not present

## 2021-12-27 ENCOUNTER — Ambulatory Visit (INDEPENDENT_AMBULATORY_CARE_PROVIDER_SITE_OTHER): Payer: 59

## 2021-12-27 DIAGNOSIS — J309 Allergic rhinitis, unspecified: Secondary | ICD-10-CM | POA: Diagnosis not present

## 2022-01-01 ENCOUNTER — Ambulatory Visit (INDEPENDENT_AMBULATORY_CARE_PROVIDER_SITE_OTHER): Payer: 59

## 2022-01-01 ENCOUNTER — Other Ambulatory Visit: Payer: Self-pay

## 2022-01-01 DIAGNOSIS — T63441D Toxic effect of venom of bees, accidental (unintentional), subsequent encounter: Secondary | ICD-10-CM | POA: Diagnosis not present

## 2022-01-06 ENCOUNTER — Ambulatory Visit (INDEPENDENT_AMBULATORY_CARE_PROVIDER_SITE_OTHER): Payer: 59 | Admitting: *Deleted

## 2022-01-06 DIAGNOSIS — J309 Allergic rhinitis, unspecified: Secondary | ICD-10-CM

## 2022-01-08 ENCOUNTER — Ambulatory Visit (INDEPENDENT_AMBULATORY_CARE_PROVIDER_SITE_OTHER): Payer: 59

## 2022-01-08 ENCOUNTER — Other Ambulatory Visit: Payer: Self-pay

## 2022-01-08 DIAGNOSIS — T63441D Toxic effect of venom of bees, accidental (unintentional), subsequent encounter: Secondary | ICD-10-CM | POA: Diagnosis not present

## 2022-01-15 ENCOUNTER — Ambulatory Visit (INDEPENDENT_AMBULATORY_CARE_PROVIDER_SITE_OTHER): Payer: 59

## 2022-01-15 ENCOUNTER — Other Ambulatory Visit: Payer: Self-pay

## 2022-01-15 DIAGNOSIS — T63441D Toxic effect of venom of bees, accidental (unintentional), subsequent encounter: Secondary | ICD-10-CM | POA: Diagnosis not present

## 2022-01-22 ENCOUNTER — Ambulatory Visit (INDEPENDENT_AMBULATORY_CARE_PROVIDER_SITE_OTHER): Payer: 59

## 2022-01-22 ENCOUNTER — Other Ambulatory Visit: Payer: Self-pay

## 2022-01-22 DIAGNOSIS — T63441D Toxic effect of venom of bees, accidental (unintentional), subsequent encounter: Secondary | ICD-10-CM

## 2022-01-24 ENCOUNTER — Ambulatory Visit (INDEPENDENT_AMBULATORY_CARE_PROVIDER_SITE_OTHER): Payer: 59 | Admitting: *Deleted

## 2022-01-24 DIAGNOSIS — J309 Allergic rhinitis, unspecified: Secondary | ICD-10-CM

## 2022-01-28 ENCOUNTER — Ambulatory Visit (INDEPENDENT_AMBULATORY_CARE_PROVIDER_SITE_OTHER): Payer: 59

## 2022-01-28 ENCOUNTER — Other Ambulatory Visit: Payer: Self-pay

## 2022-01-28 DIAGNOSIS — T63441D Toxic effect of venom of bees, accidental (unintentional), subsequent encounter: Secondary | ICD-10-CM

## 2022-01-29 ENCOUNTER — Ambulatory Visit: Payer: 59

## 2022-01-31 ENCOUNTER — Ambulatory Visit (INDEPENDENT_AMBULATORY_CARE_PROVIDER_SITE_OTHER): Payer: 59

## 2022-01-31 DIAGNOSIS — J309 Allergic rhinitis, unspecified: Secondary | ICD-10-CM

## 2022-02-05 ENCOUNTER — Other Ambulatory Visit: Payer: Self-pay

## 2022-02-05 ENCOUNTER — Ambulatory Visit (INDEPENDENT_AMBULATORY_CARE_PROVIDER_SITE_OTHER): Payer: 59

## 2022-02-05 DIAGNOSIS — T63441D Toxic effect of venom of bees, accidental (unintentional), subsequent encounter: Secondary | ICD-10-CM

## 2022-02-07 ENCOUNTER — Ambulatory Visit (INDEPENDENT_AMBULATORY_CARE_PROVIDER_SITE_OTHER): Payer: 59

## 2022-02-07 DIAGNOSIS — J309 Allergic rhinitis, unspecified: Secondary | ICD-10-CM

## 2022-02-12 ENCOUNTER — Other Ambulatory Visit: Payer: Self-pay

## 2022-02-12 ENCOUNTER — Ambulatory Visit (INDEPENDENT_AMBULATORY_CARE_PROVIDER_SITE_OTHER): Payer: 59

## 2022-02-12 DIAGNOSIS — T63441D Toxic effect of venom of bees, accidental (unintentional), subsequent encounter: Secondary | ICD-10-CM

## 2022-02-19 ENCOUNTER — Ambulatory Visit (INDEPENDENT_AMBULATORY_CARE_PROVIDER_SITE_OTHER): Payer: 59

## 2022-02-19 ENCOUNTER — Other Ambulatory Visit: Payer: Self-pay

## 2022-02-19 DIAGNOSIS — T63441D Toxic effect of venom of bees, accidental (unintentional), subsequent encounter: Secondary | ICD-10-CM

## 2022-02-21 ENCOUNTER — Ambulatory Visit (INDEPENDENT_AMBULATORY_CARE_PROVIDER_SITE_OTHER): Payer: 59

## 2022-02-21 DIAGNOSIS — J309 Allergic rhinitis, unspecified: Secondary | ICD-10-CM

## 2022-02-26 ENCOUNTER — Ambulatory Visit (INDEPENDENT_AMBULATORY_CARE_PROVIDER_SITE_OTHER): Payer: 59

## 2022-02-26 DIAGNOSIS — J309 Allergic rhinitis, unspecified: Secondary | ICD-10-CM

## 2022-03-03 ENCOUNTER — Ambulatory Visit (INDEPENDENT_AMBULATORY_CARE_PROVIDER_SITE_OTHER): Payer: 59

## 2022-03-03 DIAGNOSIS — J309 Allergic rhinitis, unspecified: Secondary | ICD-10-CM

## 2022-03-05 ENCOUNTER — Ambulatory Visit (INDEPENDENT_AMBULATORY_CARE_PROVIDER_SITE_OTHER): Payer: 59

## 2022-03-05 DIAGNOSIS — T63441D Toxic effect of venom of bees, accidental (unintentional), subsequent encounter: Secondary | ICD-10-CM

## 2022-03-05 NOTE — Progress Notes (Signed)
EXP 03/06/23 ?

## 2022-03-06 DIAGNOSIS — J3081 Allergic rhinitis due to animal (cat) (dog) hair and dander: Secondary | ICD-10-CM | POA: Diagnosis not present

## 2022-03-12 ENCOUNTER — Ambulatory Visit (INDEPENDENT_AMBULATORY_CARE_PROVIDER_SITE_OTHER): Payer: 59

## 2022-03-12 DIAGNOSIS — T63441D Toxic effect of venom of bees, accidental (unintentional), subsequent encounter: Secondary | ICD-10-CM | POA: Diagnosis not present

## 2022-03-19 ENCOUNTER — Ambulatory Visit (INDEPENDENT_AMBULATORY_CARE_PROVIDER_SITE_OTHER): Payer: 59

## 2022-03-19 DIAGNOSIS — T63441D Toxic effect of venom of bees, accidental (unintentional), subsequent encounter: Secondary | ICD-10-CM

## 2022-03-21 ENCOUNTER — Ambulatory Visit (INDEPENDENT_AMBULATORY_CARE_PROVIDER_SITE_OTHER): Payer: 59

## 2022-03-21 DIAGNOSIS — J309 Allergic rhinitis, unspecified: Secondary | ICD-10-CM

## 2022-03-26 ENCOUNTER — Ambulatory Visit (INDEPENDENT_AMBULATORY_CARE_PROVIDER_SITE_OTHER): Payer: 59

## 2022-03-26 DIAGNOSIS — J309 Allergic rhinitis, unspecified: Secondary | ICD-10-CM

## 2022-03-26 DIAGNOSIS — T63441D Toxic effect of venom of bees, accidental (unintentional), subsequent encounter: Secondary | ICD-10-CM

## 2022-03-31 ENCOUNTER — Ambulatory Visit (INDEPENDENT_AMBULATORY_CARE_PROVIDER_SITE_OTHER): Payer: 59

## 2022-03-31 DIAGNOSIS — J309 Allergic rhinitis, unspecified: Secondary | ICD-10-CM | POA: Diagnosis not present

## 2022-04-02 ENCOUNTER — Ambulatory Visit (INDEPENDENT_AMBULATORY_CARE_PROVIDER_SITE_OTHER): Payer: 59

## 2022-04-02 DIAGNOSIS — T63441D Toxic effect of venom of bees, accidental (unintentional), subsequent encounter: Secondary | ICD-10-CM | POA: Diagnosis not present

## 2022-04-07 DIAGNOSIS — J301 Allergic rhinitis due to pollen: Secondary | ICD-10-CM | POA: Diagnosis not present

## 2022-04-09 ENCOUNTER — Ambulatory Visit (INDEPENDENT_AMBULATORY_CARE_PROVIDER_SITE_OTHER): Payer: 59

## 2022-04-09 DIAGNOSIS — T63441D Toxic effect of venom of bees, accidental (unintentional), subsequent encounter: Secondary | ICD-10-CM | POA: Diagnosis not present

## 2022-04-16 ENCOUNTER — Ambulatory Visit (INDEPENDENT_AMBULATORY_CARE_PROVIDER_SITE_OTHER): Payer: 59

## 2022-04-16 DIAGNOSIS — T63441D Toxic effect of venom of bees, accidental (unintentional), subsequent encounter: Secondary | ICD-10-CM | POA: Diagnosis not present

## 2022-04-18 ENCOUNTER — Ambulatory Visit (INDEPENDENT_AMBULATORY_CARE_PROVIDER_SITE_OTHER): Payer: 59

## 2022-04-18 DIAGNOSIS — J309 Allergic rhinitis, unspecified: Secondary | ICD-10-CM | POA: Diagnosis not present

## 2022-04-18 DIAGNOSIS — T63441D Toxic effect of venom of bees, accidental (unintentional), subsequent encounter: Secondary | ICD-10-CM

## 2022-04-23 ENCOUNTER — Ambulatory Visit (INDEPENDENT_AMBULATORY_CARE_PROVIDER_SITE_OTHER): Payer: 59

## 2022-04-23 DIAGNOSIS — T63441D Toxic effect of venom of bees, accidental (unintentional), subsequent encounter: Secondary | ICD-10-CM

## 2022-04-25 ENCOUNTER — Ambulatory Visit (INDEPENDENT_AMBULATORY_CARE_PROVIDER_SITE_OTHER): Payer: 59

## 2022-04-25 DIAGNOSIS — J309 Allergic rhinitis, unspecified: Secondary | ICD-10-CM

## 2022-04-30 ENCOUNTER — Ambulatory Visit (INDEPENDENT_AMBULATORY_CARE_PROVIDER_SITE_OTHER): Payer: 59

## 2022-04-30 DIAGNOSIS — T63441D Toxic effect of venom of bees, accidental (unintentional), subsequent encounter: Secondary | ICD-10-CM | POA: Diagnosis not present

## 2022-05-02 ENCOUNTER — Ambulatory Visit (INDEPENDENT_AMBULATORY_CARE_PROVIDER_SITE_OTHER): Payer: 59

## 2022-05-02 DIAGNOSIS — J309 Allergic rhinitis, unspecified: Secondary | ICD-10-CM | POA: Diagnosis not present

## 2022-05-12 ENCOUNTER — Ambulatory Visit (INDEPENDENT_AMBULATORY_CARE_PROVIDER_SITE_OTHER): Payer: 59

## 2022-05-12 DIAGNOSIS — J309 Allergic rhinitis, unspecified: Secondary | ICD-10-CM

## 2022-05-14 ENCOUNTER — Ambulatory Visit (INDEPENDENT_AMBULATORY_CARE_PROVIDER_SITE_OTHER): Payer: 59

## 2022-05-14 DIAGNOSIS — T63441D Toxic effect of venom of bees, accidental (unintentional), subsequent encounter: Secondary | ICD-10-CM | POA: Diagnosis not present

## 2022-05-21 ENCOUNTER — Ambulatory Visit (INDEPENDENT_AMBULATORY_CARE_PROVIDER_SITE_OTHER): Payer: 59

## 2022-05-21 DIAGNOSIS — T63441D Toxic effect of venom of bees, accidental (unintentional), subsequent encounter: Secondary | ICD-10-CM

## 2022-05-23 ENCOUNTER — Ambulatory Visit (INDEPENDENT_AMBULATORY_CARE_PROVIDER_SITE_OTHER): Payer: 59

## 2022-05-23 DIAGNOSIS — J309 Allergic rhinitis, unspecified: Secondary | ICD-10-CM | POA: Diagnosis not present

## 2022-05-29 ENCOUNTER — Ambulatory Visit (INDEPENDENT_AMBULATORY_CARE_PROVIDER_SITE_OTHER): Payer: 59

## 2022-05-29 DIAGNOSIS — T63441D Toxic effect of venom of bees, accidental (unintentional), subsequent encounter: Secondary | ICD-10-CM | POA: Diagnosis not present

## 2022-05-29 DIAGNOSIS — J309 Allergic rhinitis, unspecified: Secondary | ICD-10-CM

## 2022-05-30 ENCOUNTER — Ambulatory Visit: Payer: 59

## 2022-06-04 ENCOUNTER — Ambulatory Visit (INDEPENDENT_AMBULATORY_CARE_PROVIDER_SITE_OTHER): Payer: 59

## 2022-06-04 DIAGNOSIS — T63441D Toxic effect of venom of bees, accidental (unintentional), subsequent encounter: Secondary | ICD-10-CM | POA: Diagnosis not present

## 2022-06-25 ENCOUNTER — Ambulatory Visit (INDEPENDENT_AMBULATORY_CARE_PROVIDER_SITE_OTHER): Payer: 59

## 2022-06-25 DIAGNOSIS — T63441D Toxic effect of venom of bees, accidental (unintentional), subsequent encounter: Secondary | ICD-10-CM | POA: Diagnosis not present

## 2022-07-02 ENCOUNTER — Ambulatory Visit: Payer: 59

## 2024-03-31 NOTE — Patient Instructions (Addendum)
 Allergic rhinitis-not well controlled Continue avoidance measures directed toward pollen, mold, dust mite, and cockroach as listed below Start hydroxyzine 25 mg as prescribed by your primary care physician. If it makes you to sleepy restart Xyzal 5 mg once a day as needed for runny nose or itch Start Flonase (fluticasone)1 to 2 sprays in each nostril once a day as needed for stuffy nose. In the right nostril, point the applicator out toward the right ear. In the left nostril, point the applicator out toward the left ear Consider saline nasal rinses as needed for nasal symptoms. Use this before any medicated nasal sprays for best result Schedule an appointment for testing to environmental allergens.  You will need to be off all antihistamines (hydroxyzine or Xyzal) 3 days prior to this appointment   Allergic conjunctivitis-not well controlled Start Pataday placing 1 drop in each eye once a day as needed for itchy watery eyes.  Sample given.  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 Denies concomitant cardiorespiratory and gastrointestinal symptoms  practice avoidance measures as outlined below when possible    Follow up: Schedule skin testing appointment to environmental allergens (1-55) at your convenience

## 2024-04-01 ENCOUNTER — Encounter: Payer: Self-pay | Admitting: Family

## 2024-04-01 ENCOUNTER — Other Ambulatory Visit: Payer: Self-pay

## 2024-04-01 ENCOUNTER — Ambulatory Visit (INDEPENDENT_AMBULATORY_CARE_PROVIDER_SITE_OTHER): Admitting: Family

## 2024-04-01 VITALS — BP 132/70 | HR 78 | Temp 97.9°F | Resp 20 | Wt 299.1 lb

## 2024-04-01 DIAGNOSIS — J3089 Other allergic rhinitis: Secondary | ICD-10-CM

## 2024-04-01 DIAGNOSIS — J302 Other seasonal allergic rhinitis: Secondary | ICD-10-CM

## 2024-04-01 DIAGNOSIS — T63441D Toxic effect of venom of bees, accidental (unintentional), subsequent encounter: Secondary | ICD-10-CM | POA: Diagnosis not present

## 2024-04-01 DIAGNOSIS — H1013 Acute atopic conjunctivitis, bilateral: Secondary | ICD-10-CM | POA: Diagnosis not present

## 2024-04-01 MED ORDER — OLOPATADINE HCL 0.2 % OP SOLN: OPHTHALMIC | 5 refills | Status: AC

## 2024-04-01 MED ORDER — FLUTICASONE PROPIONATE 50 MCG/ACT NA SUSP: NASAL | 5 refills | Status: AC

## 2024-04-01 NOTE — Progress Notes (Signed)
 [address] [place] [id number] Dept: [phone]  FOLLOW UP NOTE  Patient ID: Carl Hicks, male    DOB: January 28, 1971  Age: 53 y.o. MRN: [id number] Date of Office Visit: 04/01/2024  Assessment  Chief Complaint: Follow-up (Wants to start grass shots. C/o itchy watery eyes, sinus issues)  HPI Carl Hicks is a 53 year old male who presents today for an acute visit of allergies and allergic conjunctivitis. He was last seen on September 04, 2021 by Marinus Sic, FNP.  He reports that over the past 2 months he has lost 50 pounds since starting a keto diet.  He is eating less than 50 carbs per day.  He is feeling better.  Allergic rhinitis: He reports that he would like to start allergy injections directed towards grass.  He reports clear rhinorrhea, swollen/red/ itchy watery eyes, nasal congestion, sinus headaches, and a sore throat the other day.  He denies postnasal drip.  He has not been treated for any sinus infections since we last saw him.  He currently has been taking Xyzal 5 mg daily and it helps lightly take the edge off his symptoms.  Xyzal is the only allergy medicine he tolerates.  He mentions that his body reacts differently to over-the-counter medications.  Zyrtec knocks him out and other antihistamines put him into a panic attack.  He recently saw his primary care physician who recommended that he try hydroxyzine 25 mg at night.  He has not tried this medication yet.  He is currently not using any nasal sprays, but has used Flonase nasal spray in the past and reports that it helped a little.  He has previously done allergy injections in the past, but stopped due to cost.  He feels like it may have been a combination of both the venom injections and the environmental or injections that made it expensive.  His skin test on April 30, 2018 was positive to tree pollen, weed pollen, mold, dust mite, and cockroach.  Allergic conjunctivitis: He reports itchy, red, watery eyes.  He does wear glasses,  but does not wear contacts.  He has not using any eyedrops.  He only takes Xyzal 5 mg daily.  He is open to trying Pataday eyedrops.  He denies vision changes, eye pain, fever, and chills.  Toxic effect of venom of bees: He reports that he is no longer a beekeeper and he is not worried about bees now.  He reports that he had large local reactions in the past with bee stings, but the reactions were getting bigger.  He denies having any concomitant cardiorespiratory or gastrointestinal symptoms.  He has not had any bee stings since his last office visit.      Drug Allergies:  Allergies  Allergen Reactions   Erythromycin     Gastritis     Review of Systems: Negative except as per HPI   Physical Exam: BP 132/70   Pulse 78   Temp 97.9 F (36.6 C) (Temporal)   Resp 20   Wt 299 lb 1.6 oz (135.7 kg)   SpO2 94%   BMI 38.40 kg/m    Physical Exam Constitutional:      Appearance: Normal appearance.  HENT:     Head: Normocephalic and atraumatic.     Comments: Pharynx normal, eyes normal, ears normal, nose: Bilateral lower turbinates mildly edematous with no drainage noted    Right Ear: Tympanic membrane, ear canal and external ear normal.     Left Ear: Tympanic membrane, ear  canal and external ear normal.     Mouth/Throat:     Mouth: Mucous membranes are moist.     Pharynx: Oropharynx is clear.  Eyes:     Comments: Bilateral conjunctival injection. No drainage noted.  Cardiovascular:     Rate and Rhythm: Regular rhythm.     Heart sounds: Normal heart sounds.  Pulmonary:     Effort: Pulmonary effort is normal.     Breath sounds: Normal breath sounds.     Comments: Lungs clear to auscultation Musculoskeletal:     Cervical back: Neck supple.  Skin:    General: Skin is warm.  Neurological:     Mental Status: He is alert and oriented to person, place, and time.  Psychiatric:        Mood and Affect: Mood normal.        Behavior: Behavior normal.        Thought Content:  Thought content normal.        Judgment: Judgment normal.     Diagnostics:  None  Assessment and Plan: 1. Seasonal and perennial allergic rhinitis   2. Allergic conjunctivitis of both eyes   3. Toxic effect of venom of bees, unintentional, subsequent encounter     No orders of the defined types were placed in this encounter.   Patient Instructions  Allergic rhinitis-not well controlled Continue avoidance measures directed toward pollen, mold, dust mite, and cockroach as listed below Start hydroxyzine 25 mg as prescribed by your primary care physician. If it makes you to sleepy restart Xyzal 5 mg once a day as needed for runny nose or itch Start Flonase (fluticasone)1 to 2 sprays in each nostril once a day as needed for stuffy nose. In the right nostril, point the applicator out toward the right ear. In the left nostril, point the applicator out toward the left ear Consider saline nasal rinses as needed for nasal symptoms. Use this before any medicated nasal sprays for best result Schedule an appointment for testing to environmental allergens.  You will need to be off all antihistamines (hydroxyzine or Xyzal) 3 days prior to this appointment   Allergic conjunctivitis-not well controlled Start Pataday placing 1 drop in each eye once a day as needed for itchy watery eyes.  Sample given.  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 Denies concomitant cardiorespiratory and gastrointestinal symptoms  practice avoidance measures as outlined below when possible    Follow up: Schedule skin testing appointment to environmental allergens (1-55) at your convenience Return for skin testing 1-55.    Thank you for the opportunity to care for this patient.  Please do not hesitate to contact me with questions.  Tinnie Forehand, FNP [facility] 

## 2024-04-10 NOTE — Patient Instructions (Addendum)
 Allergic rhinitis-not well controlled Continue avoidance measures directed toward pollen, mold, dust mite, and cockroach as listed below Continuehydroxyzine 25 mg as prescribed by your primary care physician. If it makes you to sleepy restart Xyzal 5 mg once a day as needed for runny nose or itch Continue Flonase  (fluticasone )1 to 2 sprays in each nostril once a day as needed for stuffy nose. In the right nostril, point the applicator out toward the right ear. In the left nostril, point the applicator out toward the left ear Consider saline nasal rinses as needed for nasal symptoms. Use this before any medicated nasal sprays for best result Skin testing today is: positive to tree pollen, one weed pollen and one mold with adequate controls. Intradermal skin testing today is positive to cockroach, dust mite, mold 3, mold 4, and ragweed mix Start avoidance measures as below CPT codes given to call insurance to find out cost of allergy injections. If interested in starting allergy injections give our office a call and let us  know that you would like to start allergy injections.  Discussed traditional immunotherapy versus Rush immunotherapy.   Allergic conjunctivitis-not well controlled Start Pataday  placing 1 drop in each eye once a day as needed for itchy watery eyes.  Sample given.  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 Denies concomitant cardiorespiratory and gastrointestinal symptoms  practice avoidance measures as outlined below when possible    Follow up: 3 months or sooner if needed  Reducing Pollen Exposure The American Academy of Allergy, Asthma and Immunology suggests the following steps to reduce your exposure to pollen during allergy seasons. Do not hang sheets or clothing out to dry; pollen may collect on these items. Do not mow lawns or spend time around freshly cut grass; mowing stirs up pollen. Keep windows closed at night.  Keep car  windows closed while driving. Minimize morning activities outdoors, a time when pollen counts are usually at their highest. Stay indoors as much as possible when pollen counts or humidity is high and on windy days when pollen tends to remain in the air longer. Use air conditioning when possible.  Many air conditioners have filters that trap the pollen spores. Use a HEPA room air filter to remove pollen form the indoor air you breathe.  Control of Mold Allergen Mold and fungi can grow on a variety of surfaces provided certain temperature and moisture conditions exist.  Outdoor molds grow on plants, decaying vegetation and soil.  The major outdoor mold, Alternaria and Cladosporium, are found in very high numbers during hot and dry conditions.  Generally, a late Summer - Fall peak is seen for common outdoor fungal spores.  Rain will temporarily lower outdoor mold spore count, but counts rise rapidly when the rainy period ends.  The most important indoor molds are Aspergillus and Penicillium.  Dark, humid and poorly ventilated basements are ideal sites for mold growth.  The next most common sites of mold growth are the bathroom and the kitchen.  Outdoor [employer] Use air conditioning and keep windows closed Avoid exposure to decaying vegetation. Avoid leaf raking. Avoid grain handling. Consider wearing a face mask if working in moldy areas.  Indoor Mold Control Maintain humidity below 50%. Clean washable surfaces with 5% bleach solution. Remove sources e.g. Contaminated carpets.  Control of Dust Mite Allergen Dust mites play a major role in allergic asthma and rhinitis. They occur in environments with high humidity wherever human skin is found. Dust mites  absorb humidity from the atmosphere (ie, they do not drink) and feed on organic matter (including shed human and animal skin). Dust mites are a microscopic type of insect that you cannot see with the naked eye. High levels of dust mites have  been detected from mattresses, pillows, carpets, upholstered furniture, bed covers, clothes, soft toys and any woven material. The principal allergen of the dust mite is found in its feces. A gram of dust may contain 1,000 mites and 250,000 fecal particles. Mite antigen is easily measured in the air during house cleaning activities. Dust mites do not bite and do not cause harm to humans, other than by triggering allergies/asthma.  Ways to decrease your exposure to dust mites in your home:  1. Encase mattresses, box springs and pillows with a mite-impermeable barrier or cover  2. Wash sheets, blankets and drapes weekly in hot water (130 F) with detergent and dry them in a dryer on the hot setting.  3. Have the room cleaned frequently with a vacuum cleaner and a damp dust-mop. For carpeting or rugs, vacuuming with a vacuum cleaner equipped with a high-efficiency particulate air (HEPA) filter. The dust mite allergic individual should not be in a room which is being cleaned and should wait 1 hour after cleaning before going into the room.  4. Do not sleep on upholstered furniture (eg, couches).  5. If possible removing carpeting, upholstered furniture and drapery from the home is ideal. Horizontal blinds should be eliminated in the rooms where the person spends the most time (bedroom, study, television room). Washable vinyl, roller-type shades are optimal.  6. Remove all non-washable stuffed toys from the bedroom. Wash stuffed toys weekly like sheets and blankets above.  7. Reduce indoor humidity to less than 50%. Inexpensive humidity monitors can be purchased at most hardware stores. Do not use a humidifier as can make the problem worse and are not recommended.  Control of Cockroach Allergen  Cockroach allergen has been identified as an important cause of acute attacks of asthma, especially in urban settings.  There are fifty-five species of cockroach that exist in the [place] , however only  three, the [place], [place] and [place] species produce allergen that can affect patients with Asthma.  Allergens can be obtained from fecal particles, egg casings and secretions from cockroaches.    Remove food sources. Reduce access to water. Seal access and entry points. Spray runways with 0.5-1% Diazinon or Chlorpyrifos Blow boric acid power under stoves and refrigerator. Place bait stations (hydramethylnon) at feeding sites.

## 2024-04-11 ENCOUNTER — Encounter: Payer: Self-pay | Admitting: Family

## 2024-04-11 ENCOUNTER — Ambulatory Visit (INDEPENDENT_AMBULATORY_CARE_PROVIDER_SITE_OTHER): Admitting: Family

## 2024-04-11 DIAGNOSIS — J302 Other seasonal allergic rhinitis: Secondary | ICD-10-CM | POA: Diagnosis not present

## 2024-04-11 DIAGNOSIS — J3089 Other allergic rhinitis: Secondary | ICD-10-CM

## 2024-04-11 NOTE — Progress Notes (Signed)
 Date of Service/Encounter:  04/11/24  Allergy testing appointment   Initial visit on 04/01/24, seen for allergic rhinitis not well controlled, allergic conjunctivitis- not well controlled, and toxic effect of venom of bees.  Please see that note for additional details.  Today reports for allergy diagnostic testing:    Airborne Adult Perc - 04/11/24 1336     Time Antigen Placed 1336    Allergen Manufacturer Floyd Hutchinson    Location Back    Number of Test 55    1. Control-Buffer 50% Glycerol Negative    2. Control-Histamine 3+    3. Bahia Negative    4. [place] Negative    5. Johnson Negative    6. [place]  Blue Negative    7. Meadow Fescue Negative    8. Perennial Rye Negative    9. Timothy Negative    10. Ragweed Mix Negative    11. Cocklebur Negative    12. Plantain,  English Negative    13. Baccharis 2+    14. Dog Fennel Negative    15. Russian Thistle Negative    16. Lamb's Quarters Negative    17. Sheep Sorrell Negative    18. Rough Pigweed Negative    19. Marsh Elder, Rough Negative    20. Mugwort, Common Negative    21. Box, Elder Negative    22. Cedar, red Negative    23. Sweet Gum Negative    24. Pecan Pollen 2+    25. Pine Mix Negative    26. Walnut, Black Pollen 2+    27. Red Mulberry Negative    28. Ash Mix Negative    29. Birch Mix Negative    30. Beech American Negative    31. Cottonwood, [place] Negative    32. Hickory, White 2+    33. Maple Mix Negative    34. Oak, [place] Mix Negative    35. Sycamore Eastern Negative    36. Alternaria Alternata 2+    37. Cladosporium Herbarum Negative    38. Aspergillus Mix Negative    39. Penicillium Mix Negative    40. Bipolaris Sorokiniana (Helminthosporium) Negative    41. Drechslera Spicifera (Curvularia) Negative    42. Mucor Plumbeus Negative    43. Fusarium Moniliforme Negative    44. Aureobasidium Pullulans (pullulara) Negative    45. Rhizopus Oryzae Negative    46. Botrytis Cinera Negative    47.  Epicoccum Nigrum Negative    48. Phoma Betae Negative    49. Dust Mite Mix Negative    50. Cat Hair 10,000 BAU/ml Negative    51.  Dog Epithelia Negative    52. Mixed Feathers Negative    53. Horse Epithelia Negative    54. Cockroach, German Negative    55. Tobacco Leaf Negative             Intradermal - 04/11/24 1414     Time Antigen Placed 1414    Allergen Manufacturer Other    Location Arm    Number of Test 14    Control Negative    Bahia Negative    [place] Negative    Johnson Negative    7 Grass Negative    Ragweed Mix 2+    Weed Mix Negative    Mold 2 Negative    Mold 3 3+    Mold 4 3+    Mite Mix 3+    Cat Negative    Dog Negative    Cockroach 4+  DIAGNOSTICS:  Skin Testing: Environmental allergy panel. Adequate positive and negative controls Results discussed with patient/family.  Allergic rhinitis-not well controlled Continue avoidance measures directed toward pollen, mold, dust mite, and cockroach as listed below Continue hydroxyzine 25 mg as prescribed by your primary care physician. If it makes you to sleepy restart Xyzal 5 mg once a day as needed for runny nose or itch Continue Flonase  (fluticasone )1 to 2 sprays in each nostril once a day as needed for stuffy nose. In the right nostril, point the applicator out toward the right ear. In the left nostril, point the applicator out toward the left ear Consider saline nasal rinses as needed for nasal symptoms. Use this before any medicated nasal sprays for best result Skin testing today is: positive to tree pollen, one weed pollen and one mold with adequate controls. Intradermal skin testing today is positive to cockroach, dust mite, mold 3, mold 4, and ragweed mix Start avoidance measures as below CPT codes given to call insurance to find out cost of allergy injections. If interested in starting allergy injections give our office a call and let us  know that you would like to start allergy  injections.  Discussed traditional immunotherapy versus Rush immunotherapy.   Allergic conjunctivitis-not well controlled Start Pataday  placing 1 drop in each eye once a day as needed for itchy watery eyes.  Sample given.  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 Denies concomitant cardiorespiratory and gastrointestinal symptoms  practice avoidance measures as outlined below when possible    Follow up: 3 months or sooner if needed  Reducing Pollen Exposure The American Academy of All[employer], Asthma and Immunology suggests the following steps to reduce your exposure to pollen during allergy seasons. Do not hang sheets or clothing out to dry; pollen may collect on these items. Do not mow lawns or spend time around freshly cut grass; mowing stirs up pollen. Keep windows closed at night.  Keep car windows closed while driving. Minimize morning activities outdoors, a time when pollen counts are usually at their highest. Stay indoors as much as possible when pollen counts or humidity is high and on windy days when pollen tends to remain in the air longer. Use air conditioning when possible.  Many air conditioners have filters that trap the pollen spores. Use a HEPA room air filter to remove pollen form the indoor air you breathe.  Control of Mold Allergen Mold and fungi can grow on a variety of surfaces provided certain temperature and moisture conditions exist.  Outdoor molds grow on plants, decaying vegetation and soil.  The major outdoor mold, Alternaria and Cladosporium, are found in very high numbers during hot and dry conditions.  Generally, a late Summer - Fall peak is seen for common outdoor fungal spores.  Rain will temporarily lower outdoor mold spore count, but counts rise rapidly when the rainy period ends.  The most important indoor molds are Aspergillus and Penicillium.  Dark, humid and poorly ventilated basements are ideal sites for mold growth.   The next most common sites of mold growth are the bathroom and the kitchen.  Outdoor [employer] Use air conditioning and keep windows closed Avoid exposure to decaying vegetation. Avoid leaf raking. Avoid grain handling. Consider wearing a face mask if working in moldy areas.  Indoor Mold Control Maintain humidity below 50%. Clean washable surfaces with 5% bleach solution. Remove sources e.g. Contaminated carpets.  Control of Dust Mite Allergen Dust mites play a major role  in allergic asthma and rhinitis. They occur in environments with high humidity wherever human skin is found. Dust mites absorb humidity from the atmosphere (ie, they do not drink) and feed on organic matter (including shed human and animal skin). Dust mites are a microscopic type of insect that you cannot see with the naked eye. High levels of dust mites have been detected from mattresses, pillows, carpets, upholstered furniture, bed covers, clothes, soft toys and any woven material. The principal allergen of the dust mite is found in its feces. A gram of dust may contain 1,000 mites and 250,000 fecal particles. Mite antigen is easily measured in the air during house cleaning activities. Dust mites do not bite and do not cause harm to humans, other than by triggering allergies/asthma.  Ways to decrease your exposure to dust mites in your home:  1. Encase mattresses, box springs and pillows with a mite-impermeable barrier or cover  2. Wash sheets, blankets and drapes weekly in hot water (130 F) with detergent and dry them in a dryer on the hot setting.  3. Have the room cleaned frequently with a vacuum cleaner and a damp dust-mop. For carpeting or rugs, vacuuming with a vacuum cleaner equipped with a high-efficiency particulate air (HEPA) filter. The dust mite allergic individual should not be in a room which is being cleaned and should wait 1 hour after cleaning before going into the room.  4. Do not sleep on  upholstered furniture (eg, couches).  5. If possible removing carpeting, upholstered furniture and drapery from the home is ideal. Horizontal blinds should be eliminated in the rooms where the person spends the most time (bedroom, study, television room). Washable vinyl, roller-type shades are optimal.  6. Remove all non-washable stuffed toys from the bedroom. Wash stuffed toys weekly like sheets and blankets above.  7. Reduce indoor humidity to less than 50%. Inexpensive humidity monitors can be purchased at most hardware stores. Do not use a humidifier as can make the problem worse and are not recommended.  Control of Cockroach Allergen  Cockroach allergen has been identified as an important cause of acute attacks of asthma, especially in urban settings.  There are fifty-five species of cockroach that exist in the [place] , however only three, the [place], [place] and [place] species produce allergen that can affect patients with Asthma.  Allergens can be obtained from fecal particles, egg casings and secretions from cockroaches.    Remove food sources. Reduce access to water. Seal access and entry points. Spray runways with 0.5-1% Diazinon or Chlorpyrifos Blow boric acid power under stoves and refrigerator. Place bait stations (hydramethylnon) at feeding sites.  Allergy testing results were read and interpreted by myself, documented by clinical staff.  Patient provided with copy of allergy testing along with avoidance measures when indicated.   Tinnie Forehand, FNP [facility]
